# Patient Record
Sex: Male | Born: 1940 | Race: White | Hispanic: No | Marital: Married
Health system: Southern US, Community
[De-identification: ages and names within clinical notes are randomized; demographics above are authoritative.]

---

## 2011-04-25 ENCOUNTER — Inpatient Hospital Stay: Payer: Self-pay | Admitting: Internal Medicine

## 2011-04-25 LAB — COMPREHENSIVE METABOLIC PANEL
Anion Gap: 16 (ref 7–16)
BUN: 43 mg/dL — ABNORMAL HIGH (ref 7–18)
Calcium, Total: 8.7 mg/dL (ref 8.5–10.1)
Chloride: 96 mmol/L — ABNORMAL LOW (ref 98–107)
EGFR (African American): 39 — ABNORMAL LOW
Osmolality: 290 (ref 275–301)
Potassium: 3.6 mmol/L (ref 3.5–5.1)
SGOT(AST): 24 U/L (ref 15–37)
Sodium: 136 mmol/L (ref 136–145)
Total Protein: 7.5 g/dL (ref 6.4–8.2)

## 2011-04-25 LAB — CBC
HCT: 44.2 % (ref 40.0–52.0)
MCHC: 32.6 g/dL (ref 32.0–36.0)
RBC: 5.06 10*6/uL (ref 4.40–5.90)
RDW: 14.3 % (ref 11.5–14.5)
WBC: 15.4 10*3/uL — ABNORMAL HIGH (ref 3.8–10.6)

## 2011-04-25 LAB — CK TOTAL AND CKMB (NOT AT ARMC)
CK, Total: 143 U/L (ref 35–232)
CK, Total: 115 U/L (ref 35–232)
CK-MB: 4.4 ng/mL — ABNORMAL HIGH (ref 0.5–3.6)
CK-MB: 3.9 ng/mL — ABNORMAL HIGH (ref 0.5–3.6)

## 2011-04-25 LAB — APTT: Activated PTT: 76.5 secs — ABNORMAL HIGH (ref 23.6–35.9)

## 2011-04-25 LAB — TROPONIN I: Troponin-I: 0.45 ng/mL — ABNORMAL HIGH

## 2011-04-25 LAB — PRO B NATRIURETIC PEPTIDE: B-Type Natriuretic Peptide: 8072 pg/mL — ABNORMAL HIGH (ref 0–125)

## 2011-04-26 LAB — CBC WITH DIFFERENTIAL/PLATELET
Basophil %: 0.4 %
Eosinophil #: 0.1 10*3/uL (ref 0.0–0.7)
Eosinophil %: 1.2 %
HCT: 40.6 % (ref 40.0–52.0)
HGB: 13.4 g/dL (ref 13.0–18.0)
Lymphocyte %: 13.2 %
MCH: 28.9 pg (ref 26.0–34.0)
MCHC: 33.1 g/dL (ref 32.0–36.0)
MCV: 87 fL (ref 80–100)
Monocyte #: 0.9 10*3/uL — ABNORMAL HIGH (ref 0.0–0.7)
Monocyte %: 7.7 %
Neutrophil #: 9.1 10*3/uL — ABNORMAL HIGH (ref 1.4–6.5)
Neutrophil %: 77.5 %
RBC: 4.66 10*6/uL (ref 4.40–5.90)
WBC: 11.8 10*3/uL — ABNORMAL HIGH (ref 3.8–10.6)

## 2011-04-26 LAB — BASIC METABOLIC PANEL
Anion Gap: 13 (ref 7–16)
BUN: 40 mg/dL — ABNORMAL HIGH (ref 7–18)
Chloride: 100 mmol/L (ref 98–107)
Creatinine: 1.86 mg/dL — ABNORMAL HIGH (ref 0.60–1.30)
EGFR (African American): 47 — ABNORMAL LOW
Glucose: 186 mg/dL — ABNORMAL HIGH (ref 65–99)
Potassium: 3.1 mmol/L — ABNORMAL LOW (ref 3.5–5.1)
Sodium: 139 mmol/L (ref 136–145)

## 2011-04-26 LAB — LIPID PANEL
HDL Cholesterol: 13 mg/dL — ABNORMAL LOW (ref 40–60)
Ldl Cholesterol, Calc: 25 mg/dL (ref 0–100)
Triglycerides: 275 mg/dL — ABNORMAL HIGH (ref 0–200)
VLDL Cholesterol, Calc: 55 mg/dL — ABNORMAL HIGH (ref 5–40)

## 2011-04-26 LAB — HEMOGLOBIN A1C: Hemoglobin A1C: 9.7 % — ABNORMAL HIGH (ref 4.2–6.3)

## 2011-04-26 LAB — APTT
Activated PTT: 56.7 secs — ABNORMAL HIGH (ref 23.6–35.9)
Activated PTT: 58.5 secs — ABNORMAL HIGH (ref 23.6–35.9)
Activated PTT: 70.6 secs — ABNORMAL HIGH (ref 23.6–35.9)

## 2011-04-26 LAB — MAGNESIUM: Magnesium: 1.8 mg/dL

## 2011-04-26 LAB — TROPONIN I: Troponin-I: 0.12 ng/mL — ABNORMAL HIGH

## 2011-04-27 LAB — APTT: Activated PTT: 75.1 secs — ABNORMAL HIGH (ref 23.6–35.9)

## 2011-04-27 LAB — BASIC METABOLIC PANEL
Anion Gap: 12 (ref 7–16)
BUN: 36 mg/dL — ABNORMAL HIGH (ref 7–18)
Calcium, Total: 8.3 mg/dL — ABNORMAL LOW (ref 8.5–10.1)
Chloride: 99 mmol/L (ref 98–107)
Co2: 28 mmol/L (ref 21–32)
Creatinine: 1.74 mg/dL — ABNORMAL HIGH (ref 0.60–1.30)
Potassium: 3.7 mmol/L (ref 3.5–5.1)

## 2011-04-28 LAB — APTT: Activated PTT: 77.6 secs — ABNORMAL HIGH (ref 23.6–35.9)

## 2011-04-28 LAB — CBC WITH DIFFERENTIAL/PLATELET
Basophil #: 0.1 10*3/uL (ref 0.0–0.1)
Eosinophil #: 0.1 10*3/uL (ref 0.0–0.7)
Lymphocyte #: 2.4 10*3/uL (ref 1.0–3.6)
MCH: 28.6 pg (ref 26.0–34.0)
MCHC: 32.7 g/dL (ref 32.0–36.0)
MCV: 87 fL (ref 80–100)
Monocyte #: 0.9 10*3/uL — ABNORMAL HIGH (ref 0.0–0.7)
Neutrophil #: 8.8 10*3/uL — ABNORMAL HIGH (ref 1.4–6.5)
Platelet: 233 10*3/uL (ref 150–440)
RDW: 13.9 % (ref 11.5–14.5)
WBC: 12.3 10*3/uL — ABNORMAL HIGH (ref 3.8–10.6)

## 2011-04-28 LAB — PROTIME-INR: Prothrombin Time: 14.3 secs (ref 11.5–14.7)

## 2011-04-29 LAB — CBC WITH DIFFERENTIAL/PLATELET
Basophil %: 0.3 %
Eosinophil %: 1.3 %
Lymphocyte #: 1.4 10*3/uL (ref 1.0–3.6)
MCHC: 32.6 g/dL (ref 32.0–36.0)
MCV: 88 fL (ref 80–100)
Monocyte #: 0.8 10*3/uL — ABNORMAL HIGH (ref 0.0–0.7)
Monocyte %: 7.3 %
Platelet: 227 10*3/uL (ref 150–440)
RBC: 4.73 10*6/uL (ref 4.40–5.90)
WBC: 10.6 10*3/uL (ref 3.8–10.6)

## 2011-04-29 LAB — BASIC METABOLIC PANEL
Anion Gap: 10 (ref 7–16)
Calcium, Total: 9.2 mg/dL (ref 8.5–10.1)
Co2: 28 mmol/L (ref 21–32)
EGFR (African American): 48 — ABNORMAL LOW
EGFR (Non-African Amer.): 39 — ABNORMAL LOW
Glucose: 99 mg/dL (ref 65–99)
Osmolality: 282 (ref 275–301)
Potassium: 3.9 mmol/L (ref 3.5–5.1)
Sodium: 138 mmol/L (ref 136–145)

## 2011-04-29 LAB — PROTIME-INR
INR: 1.1
Prothrombin Time: 14.5 secs (ref 11.5–14.7)

## 2011-05-01 LAB — CULTURE, BLOOD (SINGLE)

## 2011-06-23 ENCOUNTER — Ambulatory Visit: Payer: Self-pay | Admitting: Family Medicine

## 2011-07-14 ENCOUNTER — Ambulatory Visit: Payer: Self-pay | Admitting: Family Medicine

## 2011-08-02 ENCOUNTER — Ambulatory Visit: Payer: Self-pay | Admitting: Vascular Surgery

## 2011-08-02 LAB — PROTIME-INR: Prothrombin Time: 14.7 secs (ref 11.5–14.7)

## 2011-08-02 LAB — BASIC METABOLIC PANEL
Anion Gap: 7 (ref 7–16)
Chloride: 104 mmol/L (ref 98–107)
EGFR (African American): 44 — ABNORMAL LOW
EGFR (Non-African Amer.): 38 — ABNORMAL LOW
Glucose: 223 mg/dL — ABNORMAL HIGH (ref 65–99)
Potassium: 4.1 mmol/L (ref 3.5–5.1)

## 2011-08-03 ENCOUNTER — Ambulatory Visit: Payer: Self-pay | Admitting: Family Medicine

## 2011-10-03 ENCOUNTER — Ambulatory Visit: Payer: Self-pay | Admitting: Internal Medicine

## 2011-10-24 ENCOUNTER — Ambulatory Visit: Payer: Self-pay | Admitting: Vascular Surgery

## 2011-10-24 LAB — BASIC METABOLIC PANEL
Anion Gap: 9 (ref 7–16)
BUN: 50 mg/dL — ABNORMAL HIGH (ref 7–18)
Calcium, Total: 9.1 mg/dL (ref 8.5–10.1)
Co2: 30 mmol/L (ref 21–32)
EGFR (Non-African Amer.): 34 — ABNORMAL LOW
Glucose: 126 mg/dL — ABNORMAL HIGH (ref 65–99)

## 2011-10-24 LAB — CBC
HGB: 15.1 g/dL (ref 13.0–18.0)
MCH: 28.1 pg (ref 26.0–34.0)
MCV: 88 fL (ref 80–100)
RBC: 5.37 10*6/uL (ref 4.40–5.90)
RDW: 15.2 % — ABNORMAL HIGH (ref 11.5–14.5)
WBC: 9.9 10*3/uL (ref 3.8–10.6)

## 2011-10-26 ENCOUNTER — Inpatient Hospital Stay: Payer: Self-pay | Admitting: Cardiology

## 2011-10-26 LAB — PROTIME-INR
INR: 1.1
Prothrombin Time: 14.2 secs (ref 11.5–14.7)

## 2011-10-27 LAB — BASIC METABOLIC PANEL
Anion Gap: 6 — ABNORMAL LOW (ref 7–16)
BUN: 36 mg/dL — ABNORMAL HIGH (ref 7–18)
Chloride: 104 mmol/L (ref 98–107)
Co2: 30 mmol/L (ref 21–32)
Creatinine: 1.71 mg/dL — ABNORMAL HIGH (ref 0.60–1.30)
EGFR (African American): 46 — ABNORMAL LOW
Osmolality: 297 (ref 275–301)
Potassium: 4.6 mmol/L (ref 3.5–5.1)

## 2011-10-27 LAB — CBC WITH DIFFERENTIAL/PLATELET
Basophil %: 0.4 %
Eosinophil #: 0 10*3/uL (ref 0.0–0.7)
HCT: 47.3 % (ref 40.0–52.0)
HGB: 14.7 g/dL (ref 13.0–18.0)
Lymphocyte #: 1.2 10*3/uL (ref 1.0–3.6)
MCHC: 31 g/dL — ABNORMAL LOW (ref 32.0–36.0)
Monocyte #: 1.6 x10 3/mm — ABNORMAL HIGH (ref 0.2–1.0)
Neutrophil #: 12.9 10*3/uL — ABNORMAL HIGH (ref 1.4–6.5)
RBC: 5.36 10*6/uL (ref 4.40–5.90)
RDW: 15.1 % — ABNORMAL HIGH (ref 11.5–14.5)

## 2011-10-28 LAB — BASIC METABOLIC PANEL
Anion Gap: 9 (ref 7–16)
BUN: 39 mg/dL — ABNORMAL HIGH (ref 7–18)
Calcium, Total: 8.9 mg/dL (ref 8.5–10.1)
Chloride: 105 mmol/L (ref 98–107)
Co2: 28 mmol/L (ref 21–32)
Creatinine: 1.79 mg/dL — ABNORMAL HIGH (ref 0.60–1.30)
Potassium: 4.5 mmol/L (ref 3.5–5.1)
Sodium: 142 mmol/L (ref 136–145)

## 2011-10-28 LAB — CBC WITH DIFFERENTIAL/PLATELET
Basophil #: 0.1 10*3/uL (ref 0.0–0.1)
Basophil %: 0.5 %
HCT: 47.7 % (ref 40.0–52.0)
HGB: 15.7 g/dL (ref 13.0–18.0)
Lymphocyte %: 8.6 %
Monocyte #: 1.7 x10 3/mm — ABNORMAL HIGH (ref 0.2–1.0)
Monocyte %: 10.3 %
Neutrophil #: 13.2 10*3/uL — ABNORMAL HIGH (ref 1.4–6.5)
Neutrophil %: 80.5 %
WBC: 16.4 10*3/uL — ABNORMAL HIGH (ref 3.8–10.6)

## 2011-10-29 LAB — BASIC METABOLIC PANEL
BUN: 35 mg/dL — ABNORMAL HIGH (ref 7–18)
Calcium, Total: 9 mg/dL (ref 8.5–10.1)
Chloride: 107 mmol/L (ref 98–107)
EGFR (Non-African Amer.): 50 — ABNORMAL LOW
Osmolality: 307 (ref 275–301)
Potassium: 4 mmol/L (ref 3.5–5.1)
Sodium: 145 mmol/L (ref 136–145)

## 2011-10-30 LAB — CK TOTAL AND CKMB (NOT AT ARMC)
CK, Total: 4818 U/L — ABNORMAL HIGH (ref 35–232)
CK, Total: 6104 U/L — ABNORMAL HIGH (ref 35–232)
CK-MB: 62.2 ng/mL — ABNORMAL HIGH (ref 0.5–3.6)
CK-MB: 53.5 ng/mL — ABNORMAL HIGH (ref 0.5–3.6)

## 2011-10-30 LAB — CBC WITH DIFFERENTIAL/PLATELET
Basophil #: 0 10*3/uL (ref 0.0–0.1)
Basophil #: 0.2 10*3/uL — ABNORMAL HIGH (ref 0.0–0.1)
Basophil %: 0.1 %
Eosinophil %: 0 %
Eosinophil %: 0 %
HCT: 45.3 % (ref 40.0–52.0)
Lymphocyte #: 0.5 10*3/uL — ABNORMAL LOW (ref 1.0–3.6)
Lymphocyte #: 0.5 10*3/uL — ABNORMAL LOW (ref 1.0–3.6)
Lymphocyte %: 3.7 %
MCH: 28.7 pg (ref 26.0–34.0)
MCV: 89 fL (ref 80–100)
MCV: 87 fL (ref 80–100)
Monocyte #: 1.4 x10 3/mm — ABNORMAL HIGH (ref 0.2–1.0)
Monocyte %: 8.8 %
Monocyte %: 9.4 %
Neutrophil #: 14.1 10*3/uL — ABNORMAL HIGH (ref 1.4–6.5)
Platelet: 210 10*3/uL (ref 150–440)
RBC: 5.11 10*6/uL (ref 4.40–5.90)
RDW: 15.3 % — ABNORMAL HIGH (ref 11.5–14.5)
RDW: 15.6 % — ABNORMAL HIGH (ref 11.5–14.5)
WBC: 16.1 10*3/uL — ABNORMAL HIGH (ref 3.8–10.6)
WBC: 14.3 10*3/uL — ABNORMAL HIGH (ref 3.8–10.6)

## 2011-10-30 LAB — BASIC METABOLIC PANEL
Anion Gap: 11 (ref 7–16)
BUN: 38 mg/dL — ABNORMAL HIGH (ref 7–18)
BUN: 45 mg/dL — ABNORMAL HIGH (ref 7–18)
BUN: 53 mg/dL — ABNORMAL HIGH (ref 7–18)
Calcium, Total: 9 mg/dL (ref 8.5–10.1)
Calcium, Total: 8.5 mg/dL (ref 8.5–10.1)
Chloride: 108 mmol/L — ABNORMAL HIGH (ref 98–107)
Co2: 23 mmol/L (ref 21–32)
Co2: 28 mmol/L (ref 21–32)
Creatinine: 1.9 mg/dL — ABNORMAL HIGH (ref 0.60–1.30)
Creatinine: 1.81 mg/dL — ABNORMAL HIGH (ref 0.60–1.30)
EGFR (African American): 40 — ABNORMAL LOW
EGFR (African American): 43 — ABNORMAL LOW
EGFR (Non-African Amer.): 37 — ABNORMAL LOW
Glucose: 528 mg/dL (ref 65–99)
Glucose: 474 mg/dL — ABNORMAL HIGH (ref 65–99)
Osmolality: 326 (ref 275–301)
Potassium: 3.7 mmol/L (ref 3.5–5.1)
Sodium: 151 mmol/L — ABNORMAL HIGH (ref 136–145)
Sodium: 145 mmol/L (ref 136–145)
Sodium: 146 mmol/L — ABNORMAL HIGH (ref 136–145)

## 2011-10-30 LAB — CBC
HCT: 46.3 % (ref 40.0–52.0)
HGB: 14.8 g/dL (ref 13.0–18.0)
MCV: 91 fL (ref 80–100)
RBC: 5.09 10*6/uL (ref 4.40–5.90)
WBC: 21.6 10*3/uL — ABNORMAL HIGH (ref 3.8–10.6)

## 2011-10-30 LAB — MAGNESIUM: Magnesium: 3.2 mg/dL — ABNORMAL HIGH

## 2011-10-30 LAB — HEPATIC FUNCTION PANEL A (ARMC)
Albumin: 2.2 g/dL — ABNORMAL LOW (ref 3.4–5.0)
Alkaline Phosphatase: 65 U/L (ref 50–136)
Bilirubin,Total: 0.5 mg/dL (ref 0.2–1.0)
SGOT(AST): 72 U/L — ABNORMAL HIGH (ref 15–37)
SGPT (ALT): 49 U/L
Total Protein: 6 g/dL — ABNORMAL LOW (ref 6.4–8.2)

## 2011-10-30 LAB — APTT: Activated PTT: 26.8 secs (ref 23.6–35.9)

## 2011-10-31 LAB — CBC WITH DIFFERENTIAL/PLATELET
Basophil #: 0 10*3/uL (ref 0.0–0.1)
Eosinophil #: 0.1 10*3/uL (ref 0.0–0.7)
Eosinophil %: 0.5 %
HGB: 13.7 g/dL (ref 13.0–18.0)
Lymphocyte #: 1.3 10*3/uL (ref 1.0–3.6)
MCHC: 33.6 g/dL (ref 32.0–36.0)
MCV: 87 fL (ref 80–100)
Monocyte #: 1.1 x10 3/mm — ABNORMAL HIGH (ref 0.2–1.0)
Monocyte %: 8.2 %
Neutrophil #: 11 10*3/uL — ABNORMAL HIGH (ref 1.4–6.5)
Neutrophil %: 81.3 %
Platelet: 169 10*3/uL (ref 150–440)
RBC: 4.68 10*6/uL (ref 4.40–5.90)
WBC: 13.5 10*3/uL — ABNORMAL HIGH (ref 3.8–10.6)

## 2011-10-31 LAB — COMPREHENSIVE METABOLIC PANEL
Albumin: 2 g/dL — ABNORMAL LOW (ref 3.4–5.0)
Alkaline Phosphatase: 52 U/L (ref 50–136)
BUN: 62 mg/dL — ABNORMAL HIGH (ref 7–18)
Bilirubin,Total: 0.5 mg/dL (ref 0.2–1.0)
Co2: 29 mmol/L (ref 21–32)
Creatinine: 2.97 mg/dL — ABNORMAL HIGH (ref 0.60–1.30)
EGFR (African American): 24 — ABNORMAL LOW
EGFR (Non-African Amer.): 20 — ABNORMAL LOW
Glucose: 180 mg/dL — ABNORMAL HIGH (ref 65–99)
Osmolality: 318 (ref 275–301)
Potassium: 3.1 mmol/L — ABNORMAL LOW (ref 3.5–5.1)
SGPT (ALT): 65 U/L
Sodium: 149 mmol/L — ABNORMAL HIGH (ref 136–145)
Total Protein: 5.5 g/dL — ABNORMAL LOW (ref 6.4–8.2)

## 2011-10-31 LAB — CK TOTAL AND CKMB (NOT AT ARMC)
CK, Total: 4943 U/L — ABNORMAL HIGH (ref 35–232)
CK-MB: 40.3 ng/mL — ABNORMAL HIGH (ref 0.5–3.6)

## 2011-10-31 LAB — TROPONIN I: Troponin-I: 1.3 ng/mL — ABNORMAL HIGH

## 2011-10-31 LAB — APTT
Activated PTT: 51.5 secs — ABNORMAL HIGH (ref 23.6–35.9)
Activated PTT: 57.5 secs — ABNORMAL HIGH (ref 23.6–35.9)
Activated PTT: 59.3 secs — ABNORMAL HIGH (ref 23.6–35.9)
Activated PTT: 61.8 secs — ABNORMAL HIGH (ref 23.6–35.9)

## 2011-11-01 LAB — BASIC METABOLIC PANEL
Anion Gap: 8 (ref 7–16)
Calcium, Total: 7.8 mg/dL — ABNORMAL LOW (ref 8.5–10.1)
Chloride: 112 mmol/L — ABNORMAL HIGH (ref 98–107)
Co2: 29 mmol/L (ref 21–32)
Creatinine: 2.61 mg/dL — ABNORMAL HIGH (ref 0.60–1.30)
EGFR (African American): 28 — ABNORMAL LOW
Glucose: 156 mg/dL — ABNORMAL HIGH (ref 65–99)

## 2011-11-01 LAB — CBC WITH DIFFERENTIAL/PLATELET
Basophil #: 0 10*3/uL (ref 0.0–0.1)
Eosinophil #: 0.4 10*3/uL (ref 0.0–0.7)
HCT: 38.3 % — ABNORMAL LOW (ref 40.0–52.0)
HGB: 12.7 g/dL — ABNORMAL LOW (ref 13.0–18.0)
Lymphocyte #: 1.1 10*3/uL (ref 1.0–3.6)
MCHC: 33.1 g/dL (ref 32.0–36.0)
MCV: 87 fL (ref 80–100)
Monocyte #: 0.9 x10 3/mm (ref 0.2–1.0)
Neutrophil #: 11.6 10*3/uL — ABNORMAL HIGH (ref 1.4–6.5)
Platelet: 161 10*3/uL (ref 150–440)
RBC: 4.4 10*6/uL (ref 4.40–5.90)
RDW: 15.9 % — ABNORMAL HIGH (ref 11.5–14.5)

## 2011-11-01 LAB — POTASSIUM: Potassium: 3.9 mmol/L (ref 3.5–5.1)

## 2011-11-01 LAB — APTT: Activated PTT: 93.1 secs — ABNORMAL HIGH (ref 23.6–35.9)

## 2011-11-01 LAB — MAGNESIUM: Magnesium: 2.9 mg/dL — ABNORMAL HIGH

## 2011-11-02 LAB — BASIC METABOLIC PANEL
Anion Gap: 10 (ref 7–16)
BUN: 58 mg/dL — ABNORMAL HIGH (ref 7–18)
Calcium, Total: 8.3 mg/dL — ABNORMAL LOW (ref 8.5–10.1)
Co2: 27 mmol/L (ref 21–32)
EGFR (African American): 32 — ABNORMAL LOW
EGFR (Non-African Amer.): 28 — ABNORMAL LOW
Glucose: 259 mg/dL — ABNORMAL HIGH (ref 65–99)
Osmolality: 323 (ref 275–301)
Potassium: 3.7 mmol/L (ref 3.5–5.1)
Sodium: 150 mmol/L — ABNORMAL HIGH (ref 136–145)

## 2011-11-02 LAB — CBC WITH DIFFERENTIAL/PLATELET
Basophil #: 0 10*3/uL (ref 0.0–0.1)
Eosinophil #: 0.1 10*3/uL (ref 0.0–0.7)
HCT: 40.8 % (ref 40.0–52.0)
Lymphocyte #: 1 10*3/uL (ref 1.0–3.6)
Lymphocyte %: 5.9 %
MCH: 28.5 pg (ref 26.0–34.0)
MCHC: 32.5 g/dL (ref 32.0–36.0)
MCV: 88 fL (ref 80–100)
Neutrophil #: 15.3 10*3/uL — ABNORMAL HIGH (ref 1.4–6.5)
RDW: 16.1 % — ABNORMAL HIGH (ref 11.5–14.5)

## 2011-11-02 LAB — APTT: Activated PTT: 79.8 secs — ABNORMAL HIGH (ref 23.6–35.9)

## 2011-11-02 LAB — CK TOTAL AND CKMB (NOT AT ARMC): CK-MB: 53.2 ng/mL — ABNORMAL HIGH (ref 0.5–3.6)

## 2011-11-03 ENCOUNTER — Ambulatory Visit: Payer: Self-pay | Admitting: Internal Medicine

## 2011-11-03 LAB — COMPREHENSIVE METABOLIC PANEL
Albumin: 1.8 g/dL — ABNORMAL LOW (ref 3.4–5.0)
BUN: 52 mg/dL — ABNORMAL HIGH (ref 7–18)
Bilirubin,Total: 0.5 mg/dL (ref 0.2–1.0)
Calcium, Total: 8.7 mg/dL (ref 8.5–10.1)
Chloride: 112 mmol/L — ABNORMAL HIGH (ref 98–107)
Creatinine: 1.98 mg/dL — ABNORMAL HIGH (ref 0.60–1.30)
Osmolality: 320 (ref 275–301)
Potassium: 3.3 mmol/L — ABNORMAL LOW (ref 3.5–5.1)
SGOT(AST): 174 U/L — ABNORMAL HIGH (ref 15–37)
SGPT (ALT): 77 U/L
Sodium: 151 mmol/L — ABNORMAL HIGH (ref 136–145)
Total Protein: 6.3 g/dL — ABNORMAL LOW (ref 6.4–8.2)

## 2011-11-03 LAB — CBC WITH DIFFERENTIAL/PLATELET
Basophil #: 0 10*3/uL (ref 0.0–0.1)
Basophil %: 0.3 %
Eosinophil #: 0.3 10*3/uL (ref 0.0–0.7)
Lymphocyte #: 1.3 10*3/uL (ref 1.0–3.6)
MCH: 28.3 pg (ref 26.0–34.0)
MCHC: 32.3 g/dL (ref 32.0–36.0)
MCV: 88 fL (ref 80–100)
Monocyte #: 1.4 x10 3/mm — ABNORMAL HIGH (ref 0.2–1.0)
Neutrophil %: 82.6 %
Platelet: 201 10*3/uL (ref 150–440)
RBC: 4.7 10*6/uL (ref 4.40–5.90)
RDW: 16 % — ABNORMAL HIGH (ref 11.5–14.5)
WBC: 17.6 10*3/uL — ABNORMAL HIGH (ref 3.8–10.6)

## 2011-11-03 LAB — POTASSIUM: Potassium: 3.2 mmol/L — ABNORMAL LOW (ref 3.5–5.1)

## 2011-11-03 LAB — APTT: Activated PTT: 74.7 secs — ABNORMAL HIGH (ref 23.6–35.9)

## 2011-11-04 LAB — CBC WITH DIFFERENTIAL/PLATELET
Eosinophil #: 0.6 10*3/uL (ref 0.0–0.7)
Lymphocyte #: 1.7 10*3/uL (ref 1.0–3.6)
Lymphocyte %: 9.5 %
MCH: 28.3 pg (ref 26.0–34.0)
Monocyte #: 1.4 x10 3/mm — ABNORMAL HIGH (ref 0.2–1.0)
Monocyte %: 7.7 %
Neutrophil #: 14.5 10*3/uL — ABNORMAL HIGH (ref 1.4–6.5)
Neutrophil %: 79.2 %
Platelet: 226 10*3/uL (ref 150–440)
RDW: 15.8 % — ABNORMAL HIGH (ref 11.5–14.5)
WBC: 18.3 10*3/uL — ABNORMAL HIGH (ref 3.8–10.6)

## 2011-11-04 LAB — BASIC METABOLIC PANEL
BUN: 48 mg/dL — ABNORMAL HIGH (ref 7–18)
Chloride: 110 mmol/L — ABNORMAL HIGH (ref 98–107)
Co2: 28 mmol/L (ref 21–32)
Creatinine: 2.1 mg/dL — ABNORMAL HIGH (ref 0.60–1.30)
EGFR (African American): 36 — ABNORMAL LOW
Glucose: 181 mg/dL — ABNORMAL HIGH (ref 65–99)
Sodium: 148 mmol/L — ABNORMAL HIGH (ref 136–145)

## 2011-11-05 LAB — CBC WITH DIFFERENTIAL/PLATELET
Basophil #: 0.1 10*3/uL (ref 0.0–0.1)
Basophil %: 0.6 %
Eosinophil %: 3.4 %
HCT: 37.8 % — ABNORMAL LOW (ref 40.0–52.0)
HGB: 12.4 g/dL — ABNORMAL LOW (ref 13.0–18.0)
Lymphocyte #: 1.4 10*3/uL (ref 1.0–3.6)
Lymphocyte %: 8.6 %
MCHC: 32.9 g/dL (ref 32.0–36.0)
MCV: 87 fL (ref 80–100)
Monocyte %: 7 %
Neutrophil #: 13.5 10*3/uL — ABNORMAL HIGH (ref 1.4–6.5)
RBC: 4.36 10*6/uL — ABNORMAL LOW (ref 4.40–5.90)
RDW: 15.3 % — ABNORMAL HIGH (ref 11.5–14.5)
WBC: 16.7 10*3/uL — ABNORMAL HIGH (ref 3.8–10.6)

## 2011-11-05 LAB — BASIC METABOLIC PANEL
Anion Gap: 10 (ref 7–16)
BUN: 39 mg/dL — ABNORMAL HIGH (ref 7–18)
Chloride: 106 mmol/L (ref 98–107)
Co2: 27 mmol/L (ref 21–32)
Creatinine: 1.77 mg/dL — ABNORMAL HIGH (ref 0.60–1.30)
EGFR (Non-African Amer.): 38 — ABNORMAL LOW
Glucose: 210 mg/dL — ABNORMAL HIGH (ref 65–99)

## 2011-11-05 LAB — APTT: Activated PTT: 67 secs — ABNORMAL HIGH (ref 23.6–35.9)

## 2011-11-06 LAB — VANCOMYCIN, TROUGH: Vancomycin, Trough: 12 ug/mL (ref 10–20)

## 2011-11-07 LAB — BASIC METABOLIC PANEL
Anion Gap: 9 (ref 7–16)
BUN: 35 mg/dL — ABNORMAL HIGH (ref 7–18)
Calcium, Total: 8.4 mg/dL — ABNORMAL LOW (ref 8.5–10.1)
Co2: 30 mmol/L (ref 21–32)
EGFR (African American): 46 — ABNORMAL LOW
EGFR (Non-African Amer.): 39 — ABNORMAL LOW
Glucose: 135 mg/dL — ABNORMAL HIGH (ref 65–99)
Osmolality: 295 (ref 275–301)
Potassium: 3.9 mmol/L (ref 3.5–5.1)
Sodium: 143 mmol/L (ref 136–145)

## 2011-11-07 LAB — CBC WITH DIFFERENTIAL/PLATELET
Basophil #: 0.1 10*3/uL (ref 0.0–0.1)
Eosinophil #: 0.5 10*3/uL (ref 0.0–0.7)
HCT: 38.6 % — ABNORMAL LOW (ref 40.0–52.0)
HGB: 12.3 g/dL — ABNORMAL LOW (ref 13.0–18.0)
Lymphocyte %: 10.8 %
MCHC: 31.9 g/dL — ABNORMAL LOW (ref 32.0–36.0)
MCV: 87 fL (ref 80–100)
Monocyte %: 5.7 %
Neutrophil #: 11.7 10*3/uL — ABNORMAL HIGH (ref 1.4–6.5)
Neutrophil %: 79.4 %
Platelet: 276 10*3/uL (ref 150–440)
RBC: 4.44 10*6/uL (ref 4.40–5.90)
RDW: 15.6 % — ABNORMAL HIGH (ref 11.5–14.5)

## 2011-11-07 LAB — PROTIME-INR
INR: 1.1
Prothrombin Time: 14.6 secs (ref 11.5–14.7)

## 2011-11-08 LAB — APTT: Activated PTT: 129.1 secs — ABNORMAL HIGH (ref 23.6–35.9)

## 2011-11-08 LAB — PROTIME-INR
INR: 1.2
Prothrombin Time: 15.6 secs — ABNORMAL HIGH (ref 11.5–14.7)

## 2011-11-09 LAB — BASIC METABOLIC PANEL
Anion Gap: 7 (ref 7–16)
BUN: 26 mg/dL — ABNORMAL HIGH (ref 7–18)
Calcium, Total: 8.6 mg/dL (ref 8.5–10.1)
EGFR (African American): 56 — ABNORMAL LOW
EGFR (Non-African Amer.): 48 — ABNORMAL LOW
Glucose: 223 mg/dL — ABNORMAL HIGH (ref 65–99)
Osmolality: 291 (ref 275–301)
Potassium: 3.3 mmol/L — ABNORMAL LOW (ref 3.5–5.1)

## 2011-11-09 LAB — HEMOGLOBIN: HGB: 11 g/dL — ABNORMAL LOW (ref 13.0–18.0)

## 2011-11-09 LAB — APTT: Activated PTT: 106.8 secs — ABNORMAL HIGH (ref 23.6–35.9)

## 2011-11-09 LAB — PROTIME-INR: Prothrombin Time: 14.8 secs — ABNORMAL HIGH (ref 11.5–14.7)

## 2011-12-04 ENCOUNTER — Ambulatory Visit: Payer: Self-pay | Admitting: Internal Medicine

## 2012-04-18 ENCOUNTER — Encounter: Payer: Self-pay | Admitting: Cardiology

## 2012-05-05 ENCOUNTER — Encounter: Payer: Self-pay | Admitting: Cardiology

## 2012-06-02 ENCOUNTER — Encounter: Payer: Self-pay | Admitting: Cardiology

## 2012-07-02 ENCOUNTER — Encounter: Payer: Self-pay | Admitting: Otolaryngology

## 2012-07-03 ENCOUNTER — Encounter: Payer: Self-pay | Admitting: Otolaryngology

## 2012-07-27 ENCOUNTER — Inpatient Hospital Stay: Payer: Self-pay | Admitting: Internal Medicine

## 2012-07-27 LAB — CBC WITH DIFFERENTIAL/PLATELET
Basophil #: 0.1 10*3/uL (ref 0.0–0.1)
Basophil %: 0.5 %
Eosinophil #: 0.1 10*3/uL (ref 0.0–0.7)
HCT: 48.2 % (ref 40.0–52.0)
HGB: 15.5 g/dL (ref 13.0–18.0)
Lymphocyte #: 2.6 10*3/uL (ref 1.0–3.6)
Lymphocyte %: 19.9 %
MCHC: 32.1 g/dL (ref 32.0–36.0)
Monocyte %: 7.1 %
Neutrophil #: 9.4 10*3/uL — ABNORMAL HIGH (ref 1.4–6.5)
Neutrophil %: 71.9 %
RBC: 5.56 10*6/uL (ref 4.40–5.90)
RDW: 16.4 % — ABNORMAL HIGH (ref 11.5–14.5)

## 2012-07-27 LAB — COMPREHENSIVE METABOLIC PANEL
Albumin: 3.1 g/dL — ABNORMAL LOW (ref 3.4–5.0)
Calcium, Total: 8.9 mg/dL (ref 8.5–10.1)
Chloride: 106 mmol/L (ref 98–107)
Co2: 23 mmol/L (ref 21–32)
EGFR (African American): 55 — ABNORMAL LOW
EGFR (Non-African Amer.): 47 — ABNORMAL LOW
Osmolality: 285 (ref 275–301)
Potassium: 4.3 mmol/L (ref 3.5–5.1)
SGOT(AST): 184 U/L — ABNORMAL HIGH (ref 15–37)
SGPT (ALT): 155 U/L — ABNORMAL HIGH (ref 12–78)
Sodium: 136 mmol/L (ref 136–145)
Total Protein: 7.2 g/dL (ref 6.4–8.2)

## 2012-07-27 LAB — PRO B NATRIURETIC PEPTIDE: B-Type Natriuretic Peptide: 5230 pg/mL — ABNORMAL HIGH (ref 0–125)

## 2012-07-27 LAB — PROTIME-INR
INR: 1
Prothrombin Time: 13.4 secs (ref 11.5–14.7)

## 2012-07-27 LAB — CK TOTAL AND CKMB (NOT AT ARMC): CK-MB: 5.3 ng/mL — ABNORMAL HIGH (ref 0.5–3.6)

## 2012-07-28 LAB — BASIC METABOLIC PANEL
BUN: 38 mg/dL — ABNORMAL HIGH (ref 7–18)
Chloride: 104 mmol/L (ref 98–107)
Co2: 22 mmol/L (ref 21–32)
Creatinine: 1.52 mg/dL — ABNORMAL HIGH (ref 0.60–1.30)
EGFR (African American): 53 — ABNORMAL LOW
EGFR (Non-African Amer.): 46 — ABNORMAL LOW
Osmolality: 291 (ref 275–301)

## 2012-07-28 LAB — HEPATIC FUNCTION PANEL A (ARMC)
Albumin: 2.6 g/dL — ABNORMAL LOW (ref 3.4–5.0)
Alkaline Phosphatase: 135 U/L (ref 50–136)
Bilirubin, Direct: <0.05 mg/dL (ref 0.00–0.20)
Bilirubin,Total: 0.5 mg/dL (ref 0.2–1.0)
Total Protein: 6.4 g/dL (ref 6.4–8.2)

## 2012-07-28 LAB — CBC WITH DIFFERENTIAL/PLATELET
Basophil %: 0.1 %
Eosinophil #: 0 10*3/uL (ref 0.0–0.7)
HGB: 14 g/dL (ref 13.0–18.0)
Lymphocyte #: 0.9 10*3/uL — ABNORMAL LOW (ref 1.0–3.6)
MCH: 28.5 pg (ref 26.0–34.0)
Monocyte %: 1.3 %
Neutrophil #: 7.1 10*3/uL — ABNORMAL HIGH (ref 1.4–6.5)
Neutrophil %: 87.1 %
Platelet: 96 10*3/uL — ABNORMAL LOW (ref 150–440)
RBC: 4.91 10*6/uL (ref 4.40–5.90)

## 2012-07-28 LAB — LIPID PANEL
HDL Cholesterol: 42 mg/dL (ref 40–60)
Ldl Cholesterol, Calc: 73 mg/dL (ref 0–100)

## 2012-07-28 LAB — TROPONIN I
Troponin-I: 0.04 ng/mL
Troponin-I: 0.03 ng/mL

## 2012-08-02 ENCOUNTER — Encounter: Payer: Self-pay | Admitting: Otolaryngology

## 2013-03-26 ENCOUNTER — Ambulatory Visit: Payer: Self-pay | Admitting: Family Medicine

## 2013-05-30 ENCOUNTER — Inpatient Hospital Stay: Payer: Self-pay | Admitting: Internal Medicine

## 2013-05-30 LAB — COMPREHENSIVE METABOLIC PANEL
ALK PHOS: 224 U/L — AB
Albumin: 2.7 g/dL — ABNORMAL LOW (ref 3.4–5.0)
Anion Gap: 5 — ABNORMAL LOW (ref 7–16)
BILIRUBIN TOTAL: 0.7 mg/dL (ref 0.2–1.0)
BUN: 25 mg/dL — AB (ref 7–18)
Calcium, Total: 8.9 mg/dL (ref 8.5–10.1)
Chloride: 104 mmol/L (ref 98–107)
Co2: 26 mmol/L (ref 21–32)
Creatinine: 1.59 mg/dL — ABNORMAL HIGH (ref 0.60–1.30)
EGFR (African American): 50 — ABNORMAL LOW
GFR CALC NON AF AMER: 43 — AB
Glucose: 251 mg/dL — ABNORMAL HIGH (ref 65–99)
Osmolality: 283 (ref 275–301)
Potassium: 4.7 mmol/L (ref 3.5–5.1)
SGOT(AST): 30 U/L (ref 15–37)
SGPT (ALT): 61 U/L (ref 12–78)
SODIUM: 135 mmol/L — AB (ref 136–145)
Total Protein: 6.9 g/dL (ref 6.4–8.2)

## 2013-05-30 LAB — URINALYSIS, COMPLETE
BLOOD: NEGATIVE
Bilirubin,UR: NEGATIVE
Glucose,UR: 50 mg/dL (ref 0–75)
Hyaline Cast: 1
Ketone: NEGATIVE
Leukocyte Esterase: NEGATIVE
Nitrite: NEGATIVE
Ph: 6 (ref 4.5–8.0)
Protein: 100
RBC,UR: 1 /HPF (ref 0–5)
SQUAMOUS EPITHELIAL: NONE SEEN
Specific Gravity: 1.005 (ref 1.003–1.030)
WBC UR: <1 /HPF (ref 0–5)

## 2013-05-30 LAB — CK TOTAL AND CKMB (NOT AT ARMC)
CK, Total: 73 U/L
CK, Total: 70 U/L
CK-MB: 5.2 ng/mL — ABNORMAL HIGH (ref 0.5–3.6)
CK-MB: 4.8 ng/mL — ABNORMAL HIGH (ref 0.5–3.6)

## 2013-05-30 LAB — CBC
HCT: 43.1 % (ref 40.0–52.0)
HGB: 14.3 g/dL (ref 13.0–18.0)
MCH: 29 pg (ref 26.0–34.0)
MCHC: 33.1 g/dL (ref 32.0–36.0)
MCV: 88 fL (ref 80–100)
Platelet: 263 10*3/uL (ref 150–440)
RBC: 4.92 10*6/uL (ref 4.40–5.90)
RDW: 15.7 % — ABNORMAL HIGH (ref 11.5–14.5)
WBC: 9.5 10*3/uL (ref 3.8–10.6)

## 2013-05-30 LAB — TROPONIN I
Troponin-I: 0.03 ng/mL
Troponin-I: 0.03 ng/mL

## 2013-05-30 LAB — PROTIME-INR
INR: 1
Prothrombin Time: 13.3 secs (ref 11.5–14.7)

## 2013-05-30 LAB — PRO B NATRIURETIC PEPTIDE: B-Type Natriuretic Peptide: 7934 pg/mL — ABNORMAL HIGH (ref 0–125)

## 2013-05-31 LAB — BASIC METABOLIC PANEL
ANION GAP: 5 — AB (ref 7–16)
BUN: 25 mg/dL — ABNORMAL HIGH (ref 7–18)
CALCIUM: 9.2 mg/dL (ref 8.5–10.1)
Chloride: 108 mmol/L — ABNORMAL HIGH (ref 98–107)
Co2: 26 mmol/L (ref 21–32)
Creatinine: 1.65 mg/dL — ABNORMAL HIGH (ref 0.60–1.30)
GFR CALC AF AMER: 48 — AB
GFR CALC NON AF AMER: 41 — AB
Glucose: 159 mg/dL — ABNORMAL HIGH (ref 65–99)
OSMOLALITY: 285 (ref 275–301)
Potassium: 3.9 mmol/L (ref 3.5–5.1)
SODIUM: 139 mmol/L (ref 136–145)

## 2013-05-31 LAB — TROPONIN I: Troponin-I: 0.03 ng/mL

## 2013-05-31 LAB — CK TOTAL AND CKMB (NOT AT ARMC)
CK, Total: 63 U/L
CK-MB: 4.6 ng/mL — ABNORMAL HIGH (ref 0.5–3.6)

## 2013-06-21 ENCOUNTER — Ambulatory Visit: Payer: Self-pay | Admitting: Vascular Surgery

## 2013-08-29 ENCOUNTER — Ambulatory Visit: Payer: Self-pay | Admitting: Vascular Surgery

## 2013-08-29 LAB — BASIC METABOLIC PANEL
ANION GAP: 7 (ref 7–16)
BUN: 35 mg/dL — ABNORMAL HIGH (ref 7–18)
CO2: 28 mmol/L (ref 21–32)
Calcium, Total: 9.5 mg/dL (ref 8.5–10.1)
Chloride: 101 mmol/L (ref 98–107)
Creatinine: 1.77 mg/dL — ABNORMAL HIGH (ref 0.60–1.30)
GFR CALC AF AMER: 44 — AB
GFR CALC NON AF AMER: 38 — AB
Glucose: 96 mg/dL (ref 65–99)
OSMOLALITY: 280 (ref 275–301)
Potassium: 4.2 mmol/L (ref 3.5–5.1)
Sodium: 136 mmol/L (ref 136–145)

## 2013-08-29 LAB — URINALYSIS, COMPLETE
BILIRUBIN, UR: NEGATIVE
Bacteria: NONE SEEN
Blood: NEGATIVE
GLUCOSE, UR: NEGATIVE mg/dL (ref 0–75)
Ketone: NEGATIVE
LEUKOCYTE ESTERASE: NEGATIVE
NITRITE: NEGATIVE
Ph: 5 (ref 4.5–8.0)
SPECIFIC GRAVITY: 1.011 (ref 1.003–1.030)
SQUAMOUS EPITHELIAL: NONE SEEN
WBC UR: 2 /HPF (ref 0–5)

## 2013-08-29 LAB — CBC
HCT: 37.2 % — AB (ref 40.0–52.0)
HGB: 11.9 g/dL — AB (ref 13.0–18.0)
MCH: 27.6 pg (ref 26.0–34.0)
MCHC: 32.1 g/dL (ref 32.0–36.0)
MCV: 86 fL (ref 80–100)
Platelet: 190 10*3/uL (ref 150–440)
RBC: 4.32 10*6/uL — ABNORMAL LOW (ref 4.40–5.90)
RDW: 16.3 % — AB (ref 11.5–14.5)
WBC: 10.4 10*3/uL (ref 3.8–10.6)

## 2013-09-02 ENCOUNTER — Inpatient Hospital Stay: Payer: Self-pay | Admitting: Internal Medicine

## 2013-09-02 LAB — CBC WITH DIFFERENTIAL/PLATELET
Basophil #: 0 10*3/uL (ref 0.0–0.1)
Basophil %: 0.5 %
EOS PCT: 1 %
Eosinophil #: 0.1 10*3/uL (ref 0.0–0.7)
HCT: 36.2 % — ABNORMAL LOW (ref 40.0–52.0)
HGB: 11.9 g/dL — ABNORMAL LOW (ref 13.0–18.0)
LYMPHS PCT: 14.8 %
Lymphocyte #: 1.1 10*3/uL (ref 1.0–3.6)
MCH: 27.7 pg (ref 26.0–34.0)
MCHC: 32.8 g/dL (ref 32.0–36.0)
MCV: 85 fL (ref 80–100)
MONOS PCT: 7 %
Monocyte #: 0.5 x10 3/mm (ref 0.2–1.0)
Neutrophil #: 5.6 10*3/uL (ref 1.4–6.5)
Neutrophil %: 76.7 %
Platelet: 163 10*3/uL (ref 150–440)
RBC: 4.28 10*6/uL — AB (ref 4.40–5.90)
RDW: 16.3 % — AB (ref 11.5–14.5)
WBC: 7.4 10*3/uL (ref 3.8–10.6)

## 2013-09-02 LAB — COMPREHENSIVE METABOLIC PANEL
ALK PHOS: 341 U/L — AB
Albumin: 2.7 g/dL — ABNORMAL LOW (ref 3.4–5.0)
Anion Gap: 8 (ref 7–16)
BUN: 47 mg/dL — AB (ref 7–18)
Bilirubin,Total: 0.9 mg/dL (ref 0.2–1.0)
CALCIUM: 9 mg/dL (ref 8.5–10.1)
CO2: 26 mmol/L (ref 21–32)
Chloride: 98 mmol/L (ref 98–107)
Creatinine: 2.13 mg/dL — ABNORMAL HIGH (ref 0.60–1.30)
EGFR (Non-African Amer.): 30 — ABNORMAL LOW
GFR CALC AF AMER: 35 — AB
GLUCOSE: 137 mg/dL — AB (ref 65–99)
Osmolality: 279 (ref 275–301)
Potassium: 3.8 mmol/L (ref 3.5–5.1)
SGOT(AST): 105 U/L — ABNORMAL HIGH (ref 15–37)
SGPT (ALT): 136 U/L — ABNORMAL HIGH (ref 12–78)
SODIUM: 132 mmol/L — AB (ref 136–145)
Total Protein: 6.8 g/dL (ref 6.4–8.2)

## 2013-09-02 LAB — TROPONIN I: Troponin-I: 0.02 ng/mL

## 2013-09-02 LAB — CK TOTAL AND CKMB (NOT AT ARMC)
CK, TOTAL: 109 U/L
CK-MB: 3.4 ng/mL (ref 0.5–3.6)

## 2013-09-03 LAB — COMPREHENSIVE METABOLIC PANEL
ALK PHOS: 267 U/L — AB
ALT: 99 U/L — AB (ref 12–78)
AST: 67 U/L — AB (ref 15–37)
Albumin: 2.5 g/dL — ABNORMAL LOW (ref 3.4–5.0)
Anion Gap: 7 (ref 7–16)
BUN: 38 mg/dL — AB (ref 7–18)
Bilirubin,Total: 0.7 mg/dL (ref 0.2–1.0)
CREATININE: 1.78 mg/dL — AB (ref 0.60–1.30)
Calcium, Total: 8.8 mg/dL (ref 8.5–10.1)
Chloride: 101 mmol/L (ref 98–107)
Co2: 28 mmol/L (ref 21–32)
EGFR (African American): 43 — ABNORMAL LOW
EGFR (Non-African Amer.): 37 — ABNORMAL LOW
Glucose: 144 mg/dL — ABNORMAL HIGH (ref 65–99)
Osmolality: 284 (ref 275–301)
Potassium: 3.6 mmol/L (ref 3.5–5.1)
Sodium: 136 mmol/L (ref 136–145)
TOTAL PROTEIN: 6.5 g/dL (ref 6.4–8.2)

## 2013-09-03 LAB — CBC WITH DIFFERENTIAL/PLATELET
BASOS ABS: 0 10*3/uL (ref 0.0–0.1)
BASOS PCT: 0.5 %
EOS PCT: 2 %
Eosinophil #: 0.1 10*3/uL (ref 0.0–0.7)
HCT: 36 % — AB (ref 40.0–52.0)
HGB: 11.7 g/dL — AB (ref 13.0–18.0)
LYMPHS ABS: 1.3 10*3/uL (ref 1.0–3.6)
Lymphocyte %: 20.6 %
MCH: 27.6 pg (ref 26.0–34.0)
MCHC: 32.6 g/dL (ref 32.0–36.0)
MCV: 85 fL (ref 80–100)
Monocyte #: 0.7 x10 3/mm (ref 0.2–1.0)
Monocyte %: 10.9 %
Neutrophil #: 4.3 10*3/uL (ref 1.4–6.5)
Neutrophil %: 66 %
PLATELETS: 162 10*3/uL (ref 150–440)
RBC: 4.24 10*6/uL — ABNORMAL LOW (ref 4.40–5.90)
RDW: 16.3 % — ABNORMAL HIGH (ref 11.5–14.5)
WBC: 6.5 10*3/uL (ref 3.8–10.6)

## 2013-09-03 LAB — LIPID PANEL
Cholesterol: 103 mg/dL (ref 0–200)
HDL Cholesterol: 23 mg/dL — ABNORMAL LOW (ref 40–60)
LDL CHOLESTEROL, CALC: 46 mg/dL (ref 0–100)
Triglycerides: 172 mg/dL (ref 0–200)
VLDL Cholesterol, Calc: 34 mg/dL (ref 5–40)

## 2013-10-18 ENCOUNTER — Inpatient Hospital Stay: Payer: Self-pay | Admitting: Vascular Surgery

## 2013-10-18 LAB — BASIC METABOLIC PANEL
ANION GAP: 7 (ref 7–16)
BUN: 39 mg/dL — ABNORMAL HIGH (ref 7–18)
CALCIUM: 9.1 mg/dL (ref 8.5–10.1)
CO2: 28 mmol/L (ref 21–32)
Chloride: 101 mmol/L (ref 98–107)
Creatinine: 1.99 mg/dL — ABNORMAL HIGH (ref 0.60–1.30)
EGFR (African American): 38 — ABNORMAL LOW
EGFR (Non-African Amer.): 33 — ABNORMAL LOW
Glucose: 186 mg/dL — ABNORMAL HIGH (ref 65–99)
Osmolality: 286 (ref 275–301)
POTASSIUM: 4.4 mmol/L (ref 3.5–5.1)
Sodium: 136 mmol/L (ref 136–145)

## 2013-10-18 LAB — PROTIME-INR
INR: 1
Prothrombin Time: 12.8 secs (ref 11.5–14.7)

## 2013-10-19 LAB — PROTIME-INR
INR: 1.1
PROTHROMBIN TIME: 14.5 s (ref 11.5–14.7)

## 2013-10-19 LAB — CBC WITH DIFFERENTIAL/PLATELET
Basophil #: 0 10*3/uL (ref 0.0–0.1)
Basophil %: 0.4 %
Eosinophil #: 0 10*3/uL (ref 0.0–0.7)
Eosinophil %: 0.1 %
HCT: 31.3 % — AB (ref 40.0–52.0)
HGB: 9.9 g/dL — ABNORMAL LOW (ref 13.0–18.0)
Lymphocyte #: 0.8 10*3/uL — ABNORMAL LOW (ref 1.0–3.6)
Lymphocyte %: 9.2 %
MCH: 26.7 pg (ref 26.0–34.0)
MCHC: 31.7 g/dL — ABNORMAL LOW (ref 32.0–36.0)
MCV: 84 fL (ref 80–100)
MONOS PCT: 8.9 %
Monocyte #: 0.8 x10 3/mm (ref 0.2–1.0)
Neutrophil #: 7.5 10*3/uL — ABNORMAL HIGH (ref 1.4–6.5)
Neutrophil %: 81.4 %
PLATELETS: 158 10*3/uL (ref 150–440)
RBC: 3.72 10*6/uL — ABNORMAL LOW (ref 4.40–5.90)
RDW: 16.8 % — AB (ref 11.5–14.5)
WBC: 9.2 10*3/uL (ref 3.8–10.6)

## 2013-10-19 LAB — BASIC METABOLIC PANEL
Anion Gap: 7 (ref 7–16)
BUN: 39 mg/dL — ABNORMAL HIGH (ref 7–18)
CO2: 25 mmol/L (ref 21–32)
Calcium, Total: 8.1 mg/dL — ABNORMAL LOW (ref 8.5–10.1)
Chloride: 105 mmol/L (ref 98–107)
Creatinine: 1.88 mg/dL — ABNORMAL HIGH (ref 0.60–1.30)
EGFR (Non-African Amer.): 35 — ABNORMAL LOW
GFR CALC AF AMER: 40 — AB
Glucose: 172 mg/dL — ABNORMAL HIGH (ref 65–99)
Osmolality: 287 (ref 275–301)
POTASSIUM: 4.8 mmol/L (ref 3.5–5.1)
SODIUM: 137 mmol/L (ref 136–145)

## 2013-10-19 LAB — APTT: ACTIVATED PTT: 31.5 s (ref 23.6–35.9)

## 2013-12-20 LAB — PATHOLOGY REPORT

## 2014-01-17 ENCOUNTER — Inpatient Hospital Stay: Payer: Self-pay | Admitting: Internal Medicine

## 2014-01-18 LAB — CBC WITH DIFFERENTIAL/PLATELET
BASOS ABS: 0.1 10*3/uL (ref 0.0–0.1)
Basophil #: 0.1 10*3/uL (ref 0.0–0.1)
Basophil %: 0.7 %
Basophil %: 0.9 %
EOS ABS: 0.1 10*3/uL (ref 0.0–0.7)
Eosinophil #: 0.1 10*3/uL (ref 0.0–0.7)
Eosinophil %: 1 %
Eosinophil %: 0.6 %
HCT: 30.9 % — ABNORMAL LOW (ref 40.0–52.0)
HCT: 32.6 % — AB (ref 40.0–52.0)
HGB: 9.4 g/dL — ABNORMAL LOW (ref 13.0–18.0)
HGB: 9.9 g/dL — ABNORMAL LOW (ref 13.0–18.0)
LYMPHS ABS: 0.7 10*3/uL — AB (ref 1.0–3.6)
LYMPHS PCT: 7.3 %
Lymphocyte #: 0.6 10*3/uL — ABNORMAL LOW (ref 1.0–3.6)
Lymphocyte %: 6.6 %
MCH: 23.6 pg — AB (ref 26.0–34.0)
MCH: 23.3 pg — AB (ref 26.0–34.0)
MCHC: 30.5 g/dL — ABNORMAL LOW (ref 32.0–36.0)
MCHC: 30.3 g/dL — ABNORMAL LOW (ref 32.0–36.0)
MCV: 78 fL — AB (ref 80–100)
MCV: 77 fL — AB (ref 80–100)
MONO ABS: 0.9 x10 3/mm (ref 0.2–1.0)
MONO ABS: 0.9 x10 3/mm (ref 0.2–1.0)
MONOS PCT: 10 %
Monocyte %: 9.2 %
NEUTROS ABS: 7.2 10*3/uL — AB (ref 1.4–6.5)
NEUTROS PCT: 81.7 %
NEUTROS PCT: 82 %
Neutrophil #: 8 10*3/uL — ABNORMAL HIGH (ref 1.4–6.5)
Platelet: 173 10*3/uL (ref 150–440)
Platelet: 191 10*3/uL (ref 150–440)
RBC: 3.98 10*6/uL — AB (ref 4.40–5.90)
RBC: 4.23 10*6/uL — AB (ref 4.40–5.90)
RDW: 18.9 % — AB (ref 11.5–14.5)
RDW: 19.2 % — AB (ref 11.5–14.5)
WBC: 8.8 10*3/uL (ref 3.8–10.6)
WBC: 9.8 10*3/uL (ref 3.8–10.6)

## 2014-01-18 LAB — URINALYSIS, COMPLETE
BILIRUBIN, UR: NEGATIVE
GLUCOSE, UR: NEGATIVE mg/dL (ref 0–75)
Hyaline Cast: 8
Ketone: NEGATIVE
Leukocyte Esterase: NEGATIVE
NITRITE: NEGATIVE
Ph: 5 (ref 4.5–8.0)
Protein: 100
SPECIFIC GRAVITY: 1.012 (ref 1.003–1.030)
Squamous Epithelial: 1

## 2014-01-18 LAB — COMPREHENSIVE METABOLIC PANEL
ALBUMIN: 2.7 g/dL — AB (ref 3.4–5.0)
ALK PHOS: 68 U/L
ALT: 18 U/L
Anion Gap: 9 (ref 7–16)
BUN: 63 mg/dL — AB (ref 7–18)
Bilirubin,Total: 0.5 mg/dL (ref 0.2–1.0)
Calcium, Total: 8.6 mg/dL (ref 8.5–10.1)
Chloride: 97 mmol/L — ABNORMAL LOW (ref 98–107)
Co2: 31 mmol/L (ref 21–32)
Creatinine: 2.3 mg/dL — ABNORMAL HIGH (ref 0.60–1.30)
EGFR (African American): 36 — ABNORMAL LOW
GFR CALC NON AF AMER: 30 — AB
GLUCOSE: 205 mg/dL — AB (ref 65–99)
OSMOLALITY: 298 (ref 275–301)
Potassium: 4.4 mmol/L (ref 3.5–5.1)
SGOT(AST): 25 U/L (ref 15–37)
Sodium: 137 mmol/L (ref 136–145)
Total Protein: 6.3 g/dL — ABNORMAL LOW (ref 6.4–8.2)

## 2014-01-18 LAB — PROTIME-INR
INR: 1.3
PROTHROMBIN TIME: 15.6 s — AB (ref 11.5–14.7)

## 2014-01-19 LAB — CBC WITH DIFFERENTIAL/PLATELET
Basophil #: 0.1 10*3/uL (ref 0.0–0.1)
Basophil %: 0.6 %
EOS ABS: 0.1 10*3/uL (ref 0.0–0.7)
EOS PCT: 0.9 %
HCT: 30 % — ABNORMAL LOW (ref 40.0–52.0)
HGB: 9 g/dL — ABNORMAL LOW (ref 13.0–18.0)
LYMPHS ABS: 0.8 10*3/uL — AB (ref 1.0–3.6)
Lymphocyte %: 7.7 %
MCH: 23.2 pg — ABNORMAL LOW (ref 26.0–34.0)
MCHC: 30.1 g/dL — ABNORMAL LOW (ref 32.0–36.0)
MCV: 77 fL — ABNORMAL LOW (ref 80–100)
MONO ABS: 1.2 x10 3/mm — AB (ref 0.2–1.0)
Monocyte %: 11.4 %
NEUTROS PCT: 79.4 %
Neutrophil #: 8.1 10*3/uL — ABNORMAL HIGH (ref 1.4–6.5)
PLATELETS: 170 10*3/uL (ref 150–440)
RBC: 3.9 10*6/uL — ABNORMAL LOW (ref 4.40–5.90)
RDW: 19.1 % — ABNORMAL HIGH (ref 11.5–14.5)
WBC: 10.2 10*3/uL (ref 3.8–10.6)

## 2014-01-19 LAB — BASIC METABOLIC PANEL
ANION GAP: 10 (ref 7–16)
BUN: 66 mg/dL — AB (ref 7–18)
CHLORIDE: 93 mmol/L — AB (ref 98–107)
CREATININE: 2.49 mg/dL — AB (ref 0.60–1.30)
Calcium, Total: 8.4 mg/dL — ABNORMAL LOW (ref 8.5–10.1)
Co2: 33 mmol/L — ABNORMAL HIGH (ref 21–32)
GFR CALC AF AMER: 33 — AB
GFR CALC NON AF AMER: 27 — AB
GLUCOSE: 159 mg/dL — AB (ref 65–99)
OSMOLALITY: 294 (ref 275–301)
Potassium: 4.1 mmol/L (ref 3.5–5.1)
Sodium: 136 mmol/L (ref 136–145)

## 2014-01-19 LAB — HEMOGLOBIN: HGB: 8.6 g/dL — AB (ref 13.0–18.0)

## 2014-01-19 LAB — MAGNESIUM: Magnesium: 2.2 mg/dL

## 2014-01-20 LAB — CBC WITH DIFFERENTIAL/PLATELET
BASOS PCT: 0.1 %
Basophil #: 0 10*3/uL (ref 0.0–0.1)
EOS ABS: 0 10*3/uL (ref 0.0–0.7)
EOS PCT: 0 %
HCT: 25.3 % — AB (ref 40.0–52.0)
HGB: 7.9 g/dL — ABNORMAL LOW (ref 13.0–18.0)
Lymphocyte #: 0.4 10*3/uL — ABNORMAL LOW (ref 1.0–3.6)
Lymphocyte %: 4.4 %
MCH: 23.6 pg — ABNORMAL LOW (ref 26.0–34.0)
MCHC: 31.1 g/dL — ABNORMAL LOW (ref 32.0–36.0)
MCV: 76 fL — AB (ref 80–100)
MONO ABS: 0.6 x10 3/mm (ref 0.2–1.0)
Monocyte %: 7.4 %
NEUTROS ABS: 7.5 10*3/uL — AB (ref 1.4–6.5)
Neutrophil %: 88.1 %
Platelet: 158 10*3/uL (ref 150–440)
RBC: 3.33 10*6/uL — AB (ref 4.40–5.90)
RDW: 19.2 % — ABNORMAL HIGH (ref 11.5–14.5)
WBC: 8.5 10*3/uL (ref 3.8–10.6)

## 2014-01-20 LAB — BASIC METABOLIC PANEL
Anion Gap: 7 (ref 7–16)
BUN: 64 mg/dL — AB (ref 7–18)
CALCIUM: 8.2 mg/dL — AB (ref 8.5–10.1)
CO2: 33 mmol/L — AB (ref 21–32)
CREATININE: 2.73 mg/dL — AB (ref 0.60–1.30)
Chloride: 96 mmol/L — ABNORMAL LOW (ref 98–107)
EGFR (Non-African Amer.): 24 — ABNORMAL LOW
GFR CALC AF AMER: 30 — AB
Glucose: 179 mg/dL — ABNORMAL HIGH (ref 65–99)
Osmolality: 295 (ref 275–301)
POTASSIUM: 4.4 mmol/L (ref 3.5–5.1)
SODIUM: 136 mmol/L (ref 136–145)

## 2014-01-21 LAB — CBC WITH DIFFERENTIAL/PLATELET
BASOS PCT: 0.1 %
Basophil #: 0 10*3/uL (ref 0.0–0.1)
Basophil #: 0 10*3/uL (ref 0.0–0.1)
Basophil %: 0 %
EOS ABS: 0 10*3/uL (ref 0.0–0.7)
EOS PCT: 0.1 %
Eosinophil #: 0 10*3/uL (ref 0.0–0.7)
Eosinophil %: 0 %
HCT: 28.2 % — AB (ref 40.0–52.0)
HCT: 28.1 % — AB (ref 40.0–52.0)
HGB: 8.7 g/dL — ABNORMAL LOW (ref 13.0–18.0)
LYMPHS ABS: 0.3 10*3/uL — AB (ref 1.0–3.6)
LYMPHS PCT: 2.7 %
Lymphocyte #: 0.4 10*3/uL — ABNORMAL LOW (ref 1.0–3.6)
Lymphocyte %: 3.6 %
MCH: 24 pg — ABNORMAL LOW (ref 26.0–34.0)
MCH: 23.7 pg — ABNORMAL LOW (ref 26.0–34.0)
MCHC: 31.2 g/dL — ABNORMAL LOW (ref 32.0–36.0)
MCHC: 30.9 g/dL — ABNORMAL LOW (ref 32.0–36.0)
MCV: 77 fL — AB (ref 80–100)
MCV: 77 fL — AB (ref 80–100)
MONO ABS: 0.4 x10 3/mm (ref 0.2–1.0)
MONOS PCT: 12.6 %
Monocyte #: 1.4 x10 3/mm — ABNORMAL HIGH (ref 0.2–1.0)
Monocyte %: 4 %
NEUTROS ABS: 9.4 10*3/uL — AB (ref 1.4–6.5)
NEUTROS ABS: 9.3 10*3/uL — AB (ref 1.4–6.5)
Neutrophil %: 93.3 %
Neutrophil %: 83.6 %
PLATELETS: 180 10*3/uL (ref 150–440)
Platelet: 167 10*3/uL (ref 150–440)
RBC: 3.67 10*6/uL — AB (ref 4.40–5.90)
RBC: 3.67 10*6/uL — AB (ref 4.40–5.90)
RDW: 19.7 % — AB (ref 11.5–14.5)
RDW: 19.6 % — ABNORMAL HIGH (ref 11.5–14.5)
WBC: 10.1 10*3/uL (ref 3.8–10.6)
WBC: 11.2 10*3/uL — ABNORMAL HIGH (ref 3.8–10.6)

## 2014-01-21 LAB — BASIC METABOLIC PANEL
ANION GAP: 10 (ref 7–16)
BUN: 76 mg/dL — ABNORMAL HIGH (ref 7–18)
CO2: 32 mmol/L (ref 21–32)
Calcium, Total: 8.6 mg/dL (ref 8.5–10.1)
Chloride: 92 mmol/L — ABNORMAL LOW (ref 98–107)
Creatinine: 2.43 mg/dL — ABNORMAL HIGH (ref 0.60–1.30)
EGFR (Non-African Amer.): 28 — ABNORMAL LOW
GFR CALC AF AMER: 34 — AB
Glucose: 247 mg/dL — ABNORMAL HIGH (ref 65–99)
Osmolality: 299 (ref 275–301)
POTASSIUM: 4.4 mmol/L (ref 3.5–5.1)
Sodium: 134 mmol/L — ABNORMAL LOW (ref 136–145)

## 2014-01-21 LAB — HEMOGLOBIN: HGB: 8.8 g/dL — ABNORMAL LOW (ref 13.0–18.0)

## 2014-01-22 LAB — BASIC METABOLIC PANEL
ANION GAP: 8 (ref 7–16)
BUN: 72 mg/dL — AB (ref 7–18)
CO2: 32 mmol/L (ref 21–32)
Calcium, Total: 9.1 mg/dL (ref 8.5–10.1)
Chloride: 94 mmol/L — ABNORMAL LOW (ref 98–107)
Creatinine: 2.03 mg/dL — ABNORMAL HIGH (ref 0.60–1.30)
EGFR (African American): 42 — ABNORMAL LOW
EGFR (Non-African Amer.): 34 — ABNORMAL LOW
Glucose: 219 mg/dL — ABNORMAL HIGH (ref 65–99)
OSMOLALITY: 296 (ref 275–301)
POTASSIUM: 4.3 mmol/L (ref 3.5–5.1)
SODIUM: 134 mmol/L — AB (ref 136–145)

## 2014-01-22 LAB — PROTIME-INR
INR: 1.1
Prothrombin Time: 14.3 secs (ref 11.5–14.7)

## 2014-01-22 LAB — HEMOGLOBIN: HGB: 8.7 g/dL — AB (ref 13.0–18.0)

## 2014-01-23 LAB — RENAL FUNCTION PANEL
ALBUMIN: 2.5 g/dL — AB (ref 3.4–5.0)
Anion Gap: 6 — ABNORMAL LOW (ref 7–16)
BUN: 68 mg/dL — ABNORMAL HIGH (ref 7–18)
CALCIUM: 9.3 mg/dL (ref 8.5–10.1)
CREATININE: 1.97 mg/dL — AB (ref 0.60–1.30)
Chloride: 95 mmol/L — ABNORMAL LOW (ref 98–107)
Co2: 34 mmol/L — ABNORMAL HIGH (ref 21–32)
EGFR (African American): 43 — ABNORMAL LOW
EGFR (Non-African Amer.): 36 — ABNORMAL LOW
Glucose: 161 mg/dL — ABNORMAL HIGH (ref 65–99)
OSMOLALITY: 293 (ref 275–301)
Phosphorus: 2.8 mg/dL (ref 2.5–4.9)
Potassium: 4.6 mmol/L (ref 3.5–5.1)
SODIUM: 135 mmol/L — AB (ref 136–145)

## 2014-01-23 LAB — PATHOLOGY REPORT

## 2014-01-24 ENCOUNTER — Encounter: Payer: Self-pay | Admitting: Internal Medicine

## 2014-02-02 ENCOUNTER — Encounter: Payer: Self-pay | Admitting: Internal Medicine

## 2014-02-12 LAB — URINALYSIS, COMPLETE
Bacteria: NONE SEEN
Bilirubin,UR: NEGATIVE
GLUCOSE, UR: NEGATIVE mg/dL (ref 0–75)
Ketone: NEGATIVE
Nitrite: NEGATIVE
PH: 5 (ref 4.5–8.0)
Protein: 100
Specific Gravity: 1.013 (ref 1.003–1.030)

## 2014-02-14 LAB — URINE CULTURE

## 2014-03-04 ENCOUNTER — Encounter: Payer: Self-pay | Admitting: Internal Medicine

## 2014-03-05 ENCOUNTER — Encounter: Payer: Self-pay | Admitting: Surgery

## 2014-03-11 LAB — WOUND AEROBIC CULTURE

## 2014-03-12 ENCOUNTER — Ambulatory Visit: Payer: Self-pay | Admitting: Surgery

## 2014-03-18 ENCOUNTER — Inpatient Hospital Stay: Payer: Self-pay | Admitting: Internal Medicine

## 2014-03-18 LAB — CBC WITH DIFFERENTIAL/PLATELET
BASOS PCT: 0.8 %
Basophil #: 0.1 10*3/uL (ref 0.0–0.1)
Eosinophil #: 0.1 10*3/uL (ref 0.0–0.7)
Eosinophil %: 0.6 %
HCT: 33.5 % — ABNORMAL LOW (ref 40.0–52.0)
HGB: 10.2 g/dL — ABNORMAL LOW (ref 13.0–18.0)
LYMPHS ABS: 1.4 10*3/uL (ref 1.0–3.6)
LYMPHS PCT: 16.2 %
MCH: 24.7 pg — ABNORMAL LOW (ref 26.0–34.0)
MCHC: 30.5 g/dL — ABNORMAL LOW (ref 32.0–36.0)
MCV: 81 fL (ref 80–100)
MONOS PCT: 8.9 %
Monocyte #: 0.8 x10 3/mm (ref 0.2–1.0)
NEUTROS PCT: 73.5 %
Neutrophil #: 6.6 10*3/uL — ABNORMAL HIGH (ref 1.4–6.5)
Platelet: 192 10*3/uL (ref 150–440)
RBC: 4.12 10*6/uL — ABNORMAL LOW (ref 4.40–5.90)
RDW: 21.8 % — ABNORMAL HIGH (ref 11.5–14.5)
WBC: 8.9 10*3/uL (ref 3.8–10.6)

## 2014-03-18 LAB — URINALYSIS, COMPLETE
Bacteria: NONE SEEN
Bilirubin,UR: NEGATIVE
Blood: NEGATIVE
GLUCOSE, UR: NEGATIVE mg/dL (ref 0–75)
KETONE: NEGATIVE
LEUKOCYTE ESTERASE: NEGATIVE
NITRITE: NEGATIVE
PH: 7 (ref 4.5–8.0)
RBC,UR: NONE SEEN /HPF (ref 0–5)
Specific Gravity: 1.009 (ref 1.003–1.030)
Squamous Epithelial: <1
WBC UR: <1 /HPF (ref 0–5)

## 2014-03-18 LAB — BASIC METABOLIC PANEL
ANION GAP: 9 (ref 7–16)
BUN: 52 mg/dL — ABNORMAL HIGH (ref 7–18)
CREATININE: 1.89 mg/dL — AB (ref 0.60–1.30)
Calcium, Total: 9 mg/dL (ref 8.5–10.1)
Chloride: 100 mmol/L (ref 98–107)
Co2: 30 mmol/L (ref 21–32)
EGFR (African American): 45 — ABNORMAL LOW
EGFR (Non-African Amer.): 37 — ABNORMAL LOW
Glucose: 176 mg/dL — ABNORMAL HIGH (ref 65–99)
Osmolality: 296 (ref 275–301)
Potassium: 4.1 mmol/L (ref 3.5–5.1)
Sodium: 139 mmol/L (ref 136–145)

## 2014-03-18 LAB — TROPONIN I
Troponin-I: 0.02 ng/mL
Troponin-I: 0.02 ng/mL
Troponin-I: 0.03 ng/mL

## 2014-03-18 LAB — CK-MB
CK-MB: 2.5 ng/mL (ref 0.5–3.6)
CK-MB: 2.5 ng/mL (ref 0.5–3.6)
CK-MB: 2.9 ng/mL (ref 0.5–3.6)

## 2014-03-21 ENCOUNTER — Ambulatory Visit: Payer: Self-pay | Admitting: Vascular Surgery

## 2014-04-04 ENCOUNTER — Encounter: Payer: Self-pay | Admitting: Internal Medicine

## 2014-04-04 ENCOUNTER — Encounter: Payer: Self-pay | Admitting: Surgery

## 2014-04-20 LAB — WOUND AEROBIC CULTURE

## 2014-04-23 ENCOUNTER — Ambulatory Visit: Payer: Self-pay | Admitting: Surgery

## 2014-05-05 ENCOUNTER — Ambulatory Visit: Payer: Self-pay | Admitting: Internal Medicine

## 2014-05-05 ENCOUNTER — Encounter: Payer: Self-pay | Admitting: Surgery

## 2014-05-07 ENCOUNTER — Inpatient Hospital Stay: Payer: Self-pay | Admitting: Internal Medicine

## 2014-05-07 LAB — CK TOTAL AND CKMB (NOT AT ARMC)
CK, Total: 281 U/L
CK-MB: 5.3 ng/mL — ABNORMAL HIGH

## 2014-05-07 LAB — COMPREHENSIVE METABOLIC PANEL
Albumin: 2.4 g/dL — ABNORMAL LOW (ref 3.4–5.0)
Alkaline Phosphatase: 92 U/L (ref 46–116)
Anion Gap: 7 (ref 7–16)
BILIRUBIN TOTAL: 0.7 mg/dL (ref 0.2–1.0)
BUN: 54 mg/dL — AB (ref 7–18)
CHLORIDE: 95 mmol/L — AB (ref 98–107)
CREATININE: 2.06 mg/dL — AB (ref 0.60–1.30)
Calcium, Total: 9 mg/dL (ref 8.5–10.1)
Co2: 30 mmol/L (ref 21–32)
EGFR (African American): 41 — ABNORMAL LOW
EGFR (Non-African Amer.): 34 — ABNORMAL LOW
GLUCOSE: 243 mg/dL — AB (ref 65–99)
Osmolality: 287 (ref 275–301)
POTASSIUM: 5.2 mmol/L — AB (ref 3.5–5.1)
SGOT(AST): 41 U/L — ABNORMAL HIGH (ref 15–37)
SGPT (ALT): 25 U/L (ref 14–63)
Sodium: 132 mmol/L — ABNORMAL LOW (ref 136–145)
TOTAL PROTEIN: 6.3 g/dL — AB (ref 6.4–8.2)

## 2014-05-07 LAB — CBC
HCT: 35 % — ABNORMAL LOW
HGB: 10.7 g/dL — ABNORMAL LOW
MCH: 24.5 pg — ABNORMAL LOW
MCHC: 30.6 g/dL — ABNORMAL LOW
MCV: 80 fL
Platelet: 198 x10 3/mm 3
RBC: 4.37 x10 6/mm 3 — ABNORMAL LOW
RDW: 20.9 % — ABNORMAL HIGH
WBC: 9.7 x10 3/mm 3

## 2014-05-07 LAB — TROPONIN I
Troponin-I: 0.03 ng/mL
Troponin-I: 0.04 ng/mL
Troponin-I: 0.03 ng/mL

## 2014-05-07 LAB — PROTIME-INR
INR: 1.4
Prothrombin Time: 17.1 s — ABNORMAL HIGH

## 2014-05-07 LAB — PRO B NATRIURETIC PEPTIDE: B-Type Natriuretic Peptide: 30020 pg/mL — ABNORMAL HIGH

## 2014-05-08 LAB — BASIC METABOLIC PANEL
ANION GAP: 8 (ref 7–16)
BUN: 53 mg/dL — AB (ref 7–18)
CHLORIDE: 96 mmol/L — AB (ref 98–107)
CREATININE: 1.9 mg/dL — AB (ref 0.60–1.30)
Calcium, Total: 9.3 mg/dL (ref 8.5–10.1)
Co2: 28 mmol/L (ref 21–32)
GFR CALC AF AMER: 45 — AB
GFR CALC NON AF AMER: 37 — AB
Glucose: 251 mg/dL — ABNORMAL HIGH (ref 65–99)
Osmolality: 287 (ref 275–301)
POTASSIUM: 4.8 mmol/L (ref 3.5–5.1)
SODIUM: 132 mmol/L — AB (ref 136–145)

## 2014-05-08 LAB — CBC WITH DIFFERENTIAL/PLATELET
Basophil #: 0 10*3/uL (ref 0.0–0.1)
Basophil %: 0.1 %
EOS ABS: 0 10*3/uL (ref 0.0–0.7)
Eosinophil %: 0.1 %
HCT: 33 % — AB (ref 40.0–52.0)
HGB: 10.3 g/dL — AB (ref 13.0–18.0)
Lymphocyte #: 0.6 10*3/uL — ABNORMAL LOW (ref 1.0–3.6)
Lymphocyte %: 8.5 %
MCH: 24.7 pg — AB (ref 26.0–34.0)
MCHC: 31.2 g/dL — AB (ref 32.0–36.0)
MCV: 79 fL — ABNORMAL LOW (ref 80–100)
Monocyte #: 0.2 x10 3/mm (ref 0.2–1.0)
Monocyte %: 2.2 %
Neutrophil #: 6.4 10*3/uL (ref 1.4–6.5)
Neutrophil %: 89.1 %
Platelet: 180 10*3/uL (ref 150–440)
RBC: 4.17 10*6/uL — ABNORMAL LOW (ref 4.40–5.90)
RDW: 20.8 % — ABNORMAL HIGH (ref 11.5–14.5)
WBC: 7.2 10*3/uL (ref 3.8–10.6)

## 2014-05-08 LAB — MAGNESIUM: MAGNESIUM: 2.4 mg/dL

## 2014-05-09 LAB — BASIC METABOLIC PANEL
ANION GAP: 6 — AB (ref 7–16)
BUN: 58 mg/dL — AB (ref 7–18)
CO2: 30 mmol/L (ref 21–32)
CREATININE: 1.95 mg/dL — AB (ref 0.60–1.30)
Calcium, Total: 9 mg/dL (ref 8.5–10.1)
Chloride: 96 mmol/L — ABNORMAL LOW (ref 98–107)
EGFR (African American): 44 — ABNORMAL LOW
EGFR (Non-African Amer.): 36 — ABNORMAL LOW
Glucose: 121 mg/dL — ABNORMAL HIGH (ref 65–99)
Osmolality: 282 (ref 275–301)
POTASSIUM: 5.1 mmol/L (ref 3.5–5.1)
SODIUM: 132 mmol/L — AB (ref 136–145)

## 2014-05-09 LAB — CBC WITH DIFFERENTIAL/PLATELET
BASOS ABS: 0 10*3/uL (ref 0.0–0.1)
BASOS PCT: 0.3 %
EOS ABS: 0 10*3/uL (ref 0.0–0.7)
Eosinophil %: 0.2 %
HCT: 33.3 % — ABNORMAL LOW (ref 40.0–52.0)
HGB: 10.2 g/dL — AB (ref 13.0–18.0)
LYMPHS PCT: 3.2 %
Lymphocyte #: 0.5 10*3/uL — ABNORMAL LOW (ref 1.0–3.6)
MCH: 23.9 pg — ABNORMAL LOW (ref 26.0–34.0)
MCHC: 30.5 g/dL — ABNORMAL LOW (ref 32.0–36.0)
MCV: 79 fL — ABNORMAL LOW (ref 80–100)
Monocyte #: 0.2 x10 3/mm (ref 0.2–1.0)
Monocyte %: 1.8 %
NEUTROS ABS: 13.3 10*3/uL — AB (ref 1.4–6.5)
NEUTROS PCT: 94.5 %
Platelet: 225 10*3/uL (ref 150–440)
RBC: 4.24 10*6/uL — ABNORMAL LOW (ref 4.40–5.90)
RDW: 20.1 % — ABNORMAL HIGH (ref 11.5–14.5)
WBC: 14.1 10*3/uL — AB (ref 3.8–10.6)

## 2014-05-10 LAB — CBC WITH DIFFERENTIAL/PLATELET
BASOS PCT: 0.1 %
Basophil #: 0 10*3/uL (ref 0.0–0.1)
Eosinophil #: 0 10*3/uL (ref 0.0–0.7)
Eosinophil %: 0 %
HCT: 31.7 % — ABNORMAL LOW (ref 40.0–52.0)
HGB: 9.9 g/dL — AB (ref 13.0–18.0)
Lymphocyte #: 0.8 10*3/uL — ABNORMAL LOW (ref 1.0–3.6)
Lymphocyte %: 9.5 %
MCH: 24.6 pg — ABNORMAL LOW (ref 26.0–34.0)
MCHC: 31.2 g/dL — AB (ref 32.0–36.0)
MCV: 79 fL — ABNORMAL LOW (ref 80–100)
MONO ABS: 0.7 x10 3/mm (ref 0.2–1.0)
Monocyte %: 8.7 %
NEUTROS PCT: 81.7 %
Neutrophil #: 6.4 10*3/uL (ref 1.4–6.5)
Platelet: 224 10*3/uL (ref 150–440)
RBC: 4.02 10*6/uL — ABNORMAL LOW (ref 4.40–5.90)
RDW: 20.6 % — AB (ref 11.5–14.5)
WBC: 7.9 10*3/uL (ref 3.8–10.6)

## 2014-05-10 LAB — BASIC METABOLIC PANEL
Anion Gap: 5 — ABNORMAL LOW (ref 7–16)
BUN: 64 mg/dL — AB (ref 7–18)
CALCIUM: 9 mg/dL (ref 8.5–10.1)
CHLORIDE: 94 mmol/L — AB (ref 98–107)
CREATININE: 1.97 mg/dL — AB (ref 0.60–1.30)
Co2: 33 mmol/L — ABNORMAL HIGH (ref 21–32)
GFR CALC AF AMER: 43 — AB
GFR CALC NON AF AMER: 36 — AB
GLUCOSE: 180 mg/dL — AB (ref 65–99)
OSMOLALITY: 287 (ref 275–301)
POTASSIUM: 4.6 mmol/L (ref 3.5–5.1)
SODIUM: 132 mmol/L — AB (ref 136–145)

## 2014-05-10 LAB — HEMOGLOBIN A1C: Hemoglobin A1C: 7.8 % — ABNORMAL HIGH (ref 4.2–6.3)

## 2014-06-03 ENCOUNTER — Encounter
Admit: 2014-06-03 | Disposition: A | Discharge status: Home / Self Care | Payer: Self-pay | Attending: Surgery | Admitting: Surgery

## 2014-06-03 ENCOUNTER — Ambulatory Visit
Admit: 2014-06-03 | Disposition: A | Discharge status: Home / Self Care | Payer: Self-pay | Attending: Internal Medicine | Admitting: Internal Medicine

## 2014-07-04 ENCOUNTER — Encounter
Admit: 2014-07-04 | Disposition: A | Discharge status: Home / Self Care | Payer: Self-pay | Attending: Surgery | Admitting: Surgery

## 2014-07-04 DEATH — deceased

## 2014-07-22 NOTE — Consult Note (Signed)
PATIENT NAME:  Larry Rice, Larry Rice MR#:  [id number] DATE OF BIRTH:  1940/08/25  DATE OF CONSULTATION:  10/30/2011  REFERRING PHYSICIAN:   CONSULTING PHYSICIAN:  Yevonne PaxSaadat A. Khan, MD  REASON FOR CONSULTATION: Acute respiratory failure.  HISTORY OF PRESENT ILLNESS: The patient is a 74 year old gentleman who was admitted on July 24th. He came in with multiple medical problems including cardiomyopathy, peripheral vascular disease, diabetes, coronary artery disease and he presented for aortobifemoral bypass graft. The patient had the procedure and was apparently doing well but yesterday he had V. fib arrest and was CODED for approximately 45 minutes, shocked about 20 times, and when the CODE was eventually called the patient apparently recovered rhythm. He is now orally intubated. He is on the ventilator and is unresponsive.   PAST MEDICAL HISTORY: 1. Coronary artery disease. 2. CKD, stage III. 3. COPD. 4. Hyperlipidemia. 5. Chronic atrial fibrillation. 6. Peripheral vascular disease.  ALLERGIES: Negative.  SOCIAL HISTORY: Negative for tobacco and alcohol at present. He is a former smoker.   FAMILY HISTORY: Positive for diabetes, hypertension, and coronary artery disease.  PAST SURGICAL HISTORY: 1. Cholecystectomy. 2. Carpal tunnel surgery.   REVIEW OF SYSTEMS: He is not able to provide.    PHYSICAL EXAMINATION:   VITAL SIGNS: At the time he is seen his temperature is about 100.5, pulse 98, respiratory rate 23, blood pressure 143/69, saturations 99%.  NECK: Basically supple. No JVD. No adenopathy.   HEENT: Could not assess extraocular movements.  CHEST: Coarse breath sounds with few distant rhonchi. No rales. Expansion was equal.   CARDIOVASCULAR: S1, S2 normal, regular rhythm. No gallop or rub.  ABDOMEN: Soft, nontender.  EXTREMITIES: Without cyanosis or clubbing. Pulses equal. No edema.  LABORATORY DATA: White count 14.3, hemoglobin 14.7, hematocrit 44.6. Blood gas was drawn  and was 7.48/35/155. He had a chest x-ray done overnight which showed no acute infiltrates.  IMPRESSION: 1. Acute respiratory failure. 2. Status post cardiac arrest.  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 At this time it's a little bit too soon to see if there's going to be any kind of meaningful neurological recovery. He was not started on hypothermia protocol due to multiple medical issues. The patient's family was at the bedside and we discussed in detail his overall prognosis which is fairly poor. I would suggest getting a Palliative Care consult to discuss options with the family. Will continue with other meds and therapy and supportive care.  PROGNOSIS OVERALL: Quite guarded.    ____________________________ Yevonne PaxSaadat A. Khan, MD sak:drc D: 10/30/2011 13:49:52 ET T: 10/30/2011 14:36:53 ET JOB#: [id number] cc: Yevonne PaxSaadat A. Khan, MD, <Dictator> Yevonne PaxSAADAT A KHAN MD ELECTRONICALLY SIGNED 11/09/2011 11:46

## 2014-07-22 NOTE — Op Note (Signed)
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   PROCEDURES PERFORMED:  1. Insertion of right IJ triple lumen catheter with ultrasound guidance.  2. Exploratory laparotomy.   SURGEON: Levora Dredge, MD  FIRST ASSISTANT: Festus Barren, MD  ANESTHESIA: General by endotracheal intubation.   FLUIDS: Per anesthesia record.   ESTIMATED BLOOD LOSS: 100 mL.   SPECIMEN: None.   INDICATIONS: Larry Rice is a 74 year old gentleman who presented with worsening pain of his right lower extremity and difficulty appreciating Doppler signals or palpable pulses. Noninvasive studies confirmed significant atherosclerotic occlusive disease. Angiography obtained suggested severe aortoiliac stenosis. It was therefore recommended he undergo aortobifemoral bypass grafting as he also had profound common femoral artery disease bilaterally.   The risks and benefits as well as alternative therapies were reviewed with the patient and he has agreed to proceed with laparotomy with the intention for aortobifemoral bypass grafting.   DESCRIPTION OF PROCEDURE: The patient is taken to the Operating Room and placed in the supine position. After adequate general anesthesia is induced and appropriate invasive monitors are placed his right neck is prepped and draped in sterile fashion. Ultrasound is placed in a sterile sleeve. Jugular vein is identified. It is echolucent, homogeneous indicating patency. Image is recorded for the permanent record. Under direct ultrasound visualization, a Seldinger needle is inserted into the jugular vein, J-wire is advanced, and a counterincision followed by the dilator  and triple-lumen is fed over the wire without difficulty. All three lumens aspirate and flush easily. The catheter is secured to the skin of the neck with 2-0 silk and a sterile dressing is applied. The patient tolerated the procedure well.   The patient is then adjusted slightly in positioning and prepped from his nipple line to his knees.   Laparotomy is then performed, a midline incision created and carried down through the soft tissues controlling hemostasis with Bovie cautery. The fascia is then incised and the abdominal cavity opened. Viscera is examined and there are no obvious abnormalities. The Omni tract is then placed and the small intestines reflected into the right gutter. Colon is delivered up superiorly onto the upper abdomen and chest area. The sigmoid is then packed into the left gutter. A midline incision is then made with Bovie cautery through the retroperitoneal tissues and the dissection is carried down to expose the aorta. The aorta is then examined. It is found to have a significant amount of plaque and to be circumferentially calcified and not clampable in its inferior aspect. Therefore, the dissection is carried more proximally. The left renal vein is encountered. It appears to be sclerotic. It is ligated between 2-0 Ethibond sutures and then divided. No significant lumen is identified on the cut end and there was no bleeding from the vein during the transection. There appears to be two renal arteries on the left. Both renal arteries on the left are densely calcified. In a similar fashion, there is one renal artery on the right, but it is densely calcified. Palpation of the aorta with near circumferential dissection demonstrates that even at the suprarenal level the aorta is not clampable. It does become softer but this appears to be at the level that is above the mesenteric vessels  and would given his comorbidities add or contribute significantly to the morbidity of any continued  surgery.   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 Therefore, the retroperitoneum was closed with running 0 Vicryl, viscera were returned to their anatomic location, and the midline incision was closed with looped PDS interspersed with [place] knots. Skin was closed with staples. The patient was subsequently transported to the recovery area in stable condition and when he is suitably recovered will undergo stenting of the    iliac and subsequently be scheduled for femoral to femoral bypass grafting with bilateral femoral endarterectomies. This decision was then conveyed in a family meeting so that all would have a clear understanding of the change in course of action. All questions were answered. ____________________________ Renford DillsGregory G. Antolin Belsito, MD ggs:slb D: 10/26/2011 15:10:20 ET T: 10/26/2011 15:45:32 ET JOB#: [id number]  cc: Renford DillsGregory G. Herman Mell, MD, <Dictator> Steele SizerMark A. Crissman, MD Renford DillsGREGORY G Makar Slatter MD ELECTRONICALLY SIGNED 10/27/2011 12:26

## 2014-07-22 NOTE — Consult Note (Signed)
PATIENT NAME:  Larry Rice, Larry Rice MR#:  [id number] DATE OF BIRTH:  08-Nov-1940  DATE OF CONSULTATION:  10/27/2011  REFERRING PHYSICIAN:  Levora DredgeGregory Schnier, MD CONSULTING PHYSICIAN:  Prabhleen Montemayor P. Juliene PinaMody, MD  PRIMARY CARE PHYSICIAN: Vonita MossMark Crissman, MD  PRIMARY CARDIOLOGIST: Harold HedgeKenneth Fath, MD  REASON FOR CONSULTATION: Hypertension.   IMPRESSION:  1. 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  2. History of cardiomyopathy, ejection fraction 27% by sestamibi on 10/15/2011 with abnormal myocardial perfusion imaging.  3. History of diabetes.  4. History of coronary artery disease.  5. History of chronic kidney failure, stage III.  6. History of chronic obstructive pulmonary disease.  7. Hyperlipidemia.  8. Probable right lower extremity cellulitis.  9. History of atrial fibrillation, on Coumadin.  10. Severe peripheral vascular disease.   PLAN:  1. I would continue metoprolol 5 mg IV every six hours since the patient is n.p.o. We will also add nitroglycerin patch while the patient is n.p.o. and hydralazine p.r.n. 2. The patient has a history of atrial fibrillation on Coumadin. He is on Lovenox 40 mg twice a day. I would suggest the patient be started on heparin drip as Lovenox 40 mg twice a day is not therapeutic for this patient with a history of atrial fibrillation on Coumadin.  3. I would continue sliding scale insulin as the patient is n.p.o. and would not administer his insulin at this time but would continue to monitor his blood sugars which seem to be tolerable at this time.  4. Tachycardia likely due to withdrawal from metoprolol as the patient is not receiving his metoprolol and this was just started. Would monitor. 5. For his other medical issues would restart medications as soon as tolerated.  6. For cellulitis without recommend adding Zosyn.   HISTORY OF PRESENT ILLNESS: This is a very pleasant 74 year old male with a history of diabetes, coronary artery disease, cardiomyopathy,  and severe peripheral vascular disease who presented for an aortic bifemoral bypass graft. Apparently he is unable to get this procedure done due to extensive atherosclerotic occlusive disease of the bilateral lower extremities and the plan according to the patient is to have a stent. Hospitalist was consulted due to elevated blood pressures as blood pressures have been running 146 to 181/76 to 92. He is normally on metoprolol, Imdur, HCTZ/lisinopril, and Lasix, as well as clonidine at home.   REVIEW OF SYSTEMS: CONSTITUTIONAL: Denies any fever or chills. Positive fatigue and weakness. EYES: No blurred or double vision. ENT: No ear pain, hearing loss. Positive snoring. RESPIRATORY: Denies cough, wheezing, or hemoptysis. Positive chronic obstructive pulmonary disease, not on oxygen. CARDIOVASCULAR: Denies any chest pain, orthopnea, palpitations, or syncope. GI: No nausea, vomiting, or diarrhea. He has abdominal pain from the open laparotomy. GENITOURINARY: No dysuria or hematuria. ENDOCRINE: No polyuria or polydipsia. HEME/LYMPH: Positive anemia and easy bruising. SKIN: No rash or lesions. MUSCULOSKELETAL: He has some back pain.  He has limited activity due to severe peripheral vascular disease. NEUROLOGIC: Positive history of cerebrovascular accident. PSYCH: Positive history of depression.   PAST MEDICAL HISTORY:  1. Chronic obstructive pulmonary disease.  2. Coronary artery disease status post myocardial infarction.  3. Atrial fibrillation on Coumadin.  4. Hypertension.  5. Diabetes.  6. History of hyperlipidemia.  7. History of peripheral vascular disease.  8. History of mild depression. 9. Gout. 10. Cardiomyopathy with ejection fraction of 27% on Sestamibi performed in early July.  MEDICATIONS: 1. Advair Diskus 250/50 twice a day. 2. Allopurinol 100 mg daily. 3. Aspirin  81 mg daily.  4. Clonidine 0.1 mg daily.  5. Coumadin 6 mg daily.  6. Lasix 40 mg daily.  7. HCTZ/lisinopril 25/20 mg  daily.  8. Imdur 120 mg daily.  9. Lantus 70 units at bedtime.  10. Metoprolol 100 mg twice a day. 11. NovoLog 15 units three times daily. 12. Percocet 5/325 mg one tablet every six hours p.r.n.  13. Ranitidine 300 mg at bedtime.  14. Simvastatin 40 mg daily.  15. Effexor 37.5 mg twice a day. 16. Ventolin HFA 2 puffs four times daily p.r.n.   ALLERGIES: No known drug allergies.  SOCIAL HISTORY: No tobacco, alcohol, or drug use. The patient was a former smoker.   FAMILY HISTORY: Positive for diabetes, hypertension, and coronary artery disease.   PAST SURGICAL HISTORY:  1. Cholecystectomy.  2. Carpal tunnel surgery.   PHYSICAL EXAMINATION:   VITAL SIGNS: Temperature 98.9, pulse 106, blood pressure 146/76, and saturation 96% on 2 liters.   GENERAL: The patient is alert and oriented, not in acute distress.   HEENT: Head is atraumatic. Pupils are round and reactive. Sclerae anicteric. Mucous membranes are moist. Oropharynx is clear.   NECK: Supple without jugular venous distention, carotid bruit, or enlarged thyroid.   CARDIOVASCULAR: Tachycardia without murmur, gallops, or rubs. PMI is not displaced.   LUNGS: Clear to auscultation without crackles, rales, rhonchi, or wheezing. Normal to percussion.   ABDOMEN: Obese. Bowel sounds are positive. The patient has a bandage on his abdomen. He is slightly tender due to the recent laparotomy.   EXTREMITIES: No clubbing, cyanosis or edema.   NEUROLOGIC: Cranial nerves II through XII are grossly intact. No focal deficits.  SKIN: No rash or lesions.   MUSCULOSKELETAL: The patient is able to move all extremities.   LABORATORY, DIAGNOSTIC AND RADIOLOGIC DATA: Sodium 140, potassium 4.6, chloride 104, bicarbonate 30, BUN 36, creatinine 1.71, glucose 362, and calcium 8.7. White blood cells 15, hemoglobin 14.7, hematocrit 48, and platelets 213.   Thank you for allowing [place] to participate in the care of this patient. We will continue to  follow.   TIME SPENT: 55 minutes.  ____________________________ Janyth Contes. Juliene Pina, MD spm:slb D: 10/27/2011 13:47:52 ET T: 10/27/2011 14:30:16 ET JOB#: [id number]  cc: Nihar Klus P. Juliene Pina, MD, <Dictator> Steele Sizer, MD Janyth Contes Raziya Aveni MD ELECTRONICALLY SIGNED 10/28/2011 15:27

## 2014-07-22 NOTE — Consult Note (Signed)
No Known Allergies:     Impression malignant HTN hx a fib on coumaind CAd/recent sestambi with EF 27% and perfusion with MI DM severe PVD unable to do aortic bi fem bypass will need stent per patient LRE rednesspossible cellulits    Plan metoprol 5 mg IV q 6 as pt is NPO hydraaizine PRN NTG patch SSI consider heparin gtt t on lovenox 40 BID but given hx a fib would rec heparin gtt if able restart outpt meds when able can start zosyn for leg cellulitis as WBC elevated 15  thank you will follow   Electronic Signatures: Adrian SaranMody, Sajid Ruppert (MD)  (Signed 25-Jul-13 13:40)  Authored: Allergies, Impression/Plan   Last Updated: 25-Jul-13 13:40 by Adrian SaranMody, Fernando Torry (MD)

## 2014-07-25 NOTE — Discharge Summary (Signed)
PATIENT NAME:  Larry Rice, Larry Rice MR#:  [id number] DATE OF BIRTH:  1940-07-26  DATE OF ADMISSION:  07/27/2012 DATE OF DISCHARGE:  07/29/2012  PRESENTING COMPLAINT: Shortness of breath.   DISCHARGE DIAGNOSES: 1.  Congestive heart failure, acute systolic.  2.  Hypertension.  3.  Cardiomyopathy, ejection fraction 30% to 35%.  4.  Peripheral vascular disease.  5.  Type 2 diabetes.  6.  Hypoxemia, not requiring oxygen.   CODE STATUS: FULL CODE.   MEDICATIONS: 1.  Advair 250/50, 1 puff b.i.d.  2.  Allopurinol 100 mg daily.  3.  Aspirin 81 mg daily.  4.  Lantus 70 units at bedtime.  5.  Ranitidine 300 mg 1 p.o. daily.  6.  Metoprolol tartrate 25 mg twice a day.  7.  NovoLog FlexPen 15 units t.i.d.  8.  Gabapentin 100 mg at bedtime as needed.  9.  Atorvastatin 20 mg at bedtime.  10.  Tamsulosin 0.4 mg daily.  11.  Lisinopril 10 mg daily.  12.  Amlodipine 5 mg daily.  13.  Tylenol 500 mg 2 tablets every 6 hours as needed for pain.  14.  Cymbalta 60 mg delayed release p.o. daily.  15.  Spironolactone 25 mg daily.  16.  Lasix 20 mg every day.   Two liters nasal cannula oxygen, continuous.   FOLLOWUP:  1.  Follow up with Dr. Lady GaryFath next week.  2.  Dr. Dossie Arbourrissman in 1 to 2 weeks.   Echo Doppler showed EF of 30% to 35%, moderately- to severely-decreased global LV systolic function, mildly dilated left atrium, right atrium, mild MR.   Cardiac enzymes x 3 negative, H and H is 14 and 42.1, platelet count is 96. White count is 8.2, creatinine is 1.52, sodium is 135, potassium is 4.3. Lipid profile within normal limits.   LFTs remained stable.   Chest x-ray consistent with pulmonary edema.   B-type natriuretic peptide was 5230.   The patient is a 74 year old Caucasian gentleman with history of hypertension, diabetes, cardiomyopathy, with EF of around 30%, came to the Emergency Room with:   1.  Acute hypoxic respiratory failure due to systolic congestive heart failure: He received BiPAP,  which was then changed to nasal cannula oxygen. The patient did qualify for home oxygen. His sats dropped down into the 80s on ambulation without oxygen. He will be set up for 2 liters oxygen at home. He received IV Lasix, diuresed well, changed to p.o. Lasix. Creatinine is stable.  2.  Acute congestive heart failure, systolic: Echo showed EF of 30% to 35%. Metoprolol and lisinopril were continued. Dr. Lady GaryFath saw the patient and recommended continuing the above meds along with followup as outpatient.  3.  Hypertension: Stable.  4.  Hyperlipidemia: Continued statins.  5.  PVD: On aspirin and statins.  6.  Type 2 diabetes: Lantus and sliding-scale were given.  7.  History of a-fib, on aspirin and metoprolol: Currently in sinus rhythm.  8.  History of gout: Allopurinol was continued.   Hospital stay otherwise remained stable. The patient will be discharged to home in stable condition along with home health RN, and oxygen has been set up.   Time spent: Forty minutes.   The patient remained a FULL CODE.    ____________________________ Wylie HailSona A. Allena KatzPatel, MD sap:dm D: 07/29/2012 11:25:00 ET T: 07/29/2012 12:30:13 ET JOB#: [id number]  cc: Andretta Ergle A. Allena KatzPatel, MD, <Dictator> Steele SizerMark A. Crissman, MD Darlin PriestlyKenneth A. Lady GaryFath, MD Willow OraSONA A Aalaysia Liggins MD ELECTRONICALLY SIGNED 08/03/2012 6:56

## 2014-07-25 NOTE — H&P (Signed)
PATIENT NAME:  Larry Rice, GALI MR#:  [id number] DATE OF BIRTH:  [birth year]  DATE OF ADMISSION:  07/27/2012  PRIMARY CARE PHYSICIAN:  Dr. Dossie Arbour  CARDIOLOGIST:  Dr. Lady Gary  CHIEF COMPLAINT:  Shortness of breath.   HISTORY OF PRESENT ILLNESS: This is a 74 year old man with multiple medical issues presents to the ER with 5 to 6-day history of shortness of breath, worse today where he was just short of breath with rest. He is coughing up clear phlegm. He has some pain in the lower back and right shoulder blade. He did have some sweating today, slight chest discomfort but nothing out of the usual. His main complaint was shortness of breath. When EMS arrived as per care manager, pulse ox was 70%. The patient placed on BiPAP. In the ER, he was found to have a chest x-ray consistent with pulmonary edema and hospitalist services were contacted for further evaluation with acute respiratory failure and acute congestive heart failure.   PAST MEDICAL HISTORY: COPD, coronary artery disease, history of atrial fibrillation, hypertension, diabetes, hyperlipidemia, peripheral vascular disease, mild depression, gout, cardiomyopathy and V. tach arrest.   PAST SURGICAL HISTORY:  Cholecystectomy, required 2 procedures to do it, carpal tunnel, CABG, attempted bypass and a defibrillator.   SOCIAL HISTORY: Quit smoking 18 years ago. No alcohol. No drug use. He used to work as a Naval architect.   FAMILY HISTORY: Mother died of heart failure, had diabetes and a stroke. Father died of heart disease, had hypertension.   ALLERGIES:  No known drug allergies.   MEDICATIONS: As per prescription writer include Advair Diskus 250/50 one inhalation twice a day, allopurinol 100 mg daily, amlodipine 5 mg daily, aspirin 81 mg daily, atorvastatin 20 mg at bedtime, Cymbalta 60 mg daily, gabapentin 100 mg at bedtime, Lantus 70 units subcutaneous injection at night, lisinopril 10 mg daily, metoprolol tartrate 25 mg 1 tablet twice a day,  NovoLog FlexPen 15 units subcutaneous injection t.i.d., ranitidine 300 mg daily, Flomax 0.4 mg daily, Tylenol 1000 mg every 6 hours as needed for pain.  REVIEW OF SYSTEMS:  CONSTITUTIONAL: Positive for sweating. No fever or chills. Positive weight gain, 5 pounds. Positive for fatigue.  EYES:  He does wear glasses. EARS, NOSE, MOUTH AND THROAT:  Decreased hearing. Positive for runny nose. No sore throat. No difficulty swallowing.  CARDIOVASCULAR:  Slight chest pain.  RESPIRATORY:  Positive for shortness of breath, coughing, clear phlegm. No hemoptysis.  GASTROINTESTINAL: Positive for abdominal pain in the midepigastric area. No nausea. No vomiting. No diarrhea. Positive for constipation, hemorrhoid, blood seen with bowel movements.  GENITOURINARY:  No burning on urination. No hematuria.  MUSCULOSKELETAL:  No joint pain.  INTEGUMENT:  No rashes or eruptions.  NEUROLOGIC:  No fainting or blackouts.  PSYCHIATRIC:  Positive for anxiety and depression.  ENDOCRINE:  No thyroid problems. HEMATOLOGIC AND LYMPHATIC:  No anemia.   PHYSICAL EXAMINATION: VITAL SIGNS: Temperature 98.1, pulse 82, respirations 20, blood pressure 143/77, pulse ox 98% on BiPAP, initial pulse ox by EMS 70%.  GENERAL:  Slight respiratory distress.  EYES: Conjunctivae and lids normal. Pupils equal, round and reactive to light. Extraocular muscles intact. No nystagmus. EARS, NOSE, MOUTH AND THROAT:  Tympanic membranes: No erythema. Nasal mucosa: No erythema. Throat:  No erythema. No exudate seen. Lips and gums:  No lesions.  NECK:  Positive for JVD. No bruits. No lymphadenopathy. No thyromegaly. No thyroid nodules palpated.  RESPIRATORY: Positive use of accessory muscles to breathe. Positive rales halfway up lung field.  CARDIOVASCULAR:  S1, S2 normal. No gallops, rubs or murmurs heard. Carotid upstroke 2+ bilaterally. No bruits.  EXTREMITIES:  Dorsalis pedis pulses 1+ bilaterally, 2+ edema, bilateral lower extremity.  ABDOMEN:   Soft, nontender. No organomegaly/splenomegaly. Normoactive bowel sounds. No masses felt.  LYMPHATIC:  No lymph nodes in the neck.  MUSCULOSKELETAL:  2+ edema. No clubbing on oxygen.  No cyanosis on oxygen. SKIN:  No ulcers or lesions seen.  NEUROLOGIC:  Cranial nerves II through XII grossly intact. LOWER EXTREMITIES:  Deep tendon reflexes 2+ bilateral. PSYCHIATRIC:  The patient is oriented to person, place and time.   LABORATORY AND RADIOLOGICAL DATA:  ABG:  pH 7.37, pCO2 39, pO2 115, bicarb 22.5, O2 saturation 98.3.  Chest x-ray:  Pulmonary edema.  White blood cell count 13.1, H and H 15.5 and 48.2, platelet count 197. Glucose 205, BUN 32, creatinine 1.48, sodium 136, potassium 4.3, chloride 106, CO2 23, calcium 8.9. Liver function tests:  Alkaline phosphatase 177, ALT 155, AST 184. BNP 5230, troponin negative.   EKG: Normal sinus rhythm, nonspecific intraventricular block.   ASSESSMENT AND PLAN: 1.  Acute respiratory failure:  BiPAP in order to oxygenate. THE PATIENT IS A FULL CODE. This is due to systolic congestive heart failure.  2. Acute systolic congestive heart failure:  IV Lasix 40 mg IV b.i.d. We will get an echocardiogram to evaluate the ejection fraction. Metoprolol will be continued. Lisinopril increased to b.i.d.  Add nitro paste.  3.  Hypertension:  Blood pressure slightly elevated. Will titrate up the metoprolol and add nitro paste and the Lasix.  4.  Hyperlipidemia:  We will continue atorvastatin right now but need to watch closely with the elevated liver function tests.  5.  Peripheral vascular disease:  The patient only on aspirin at this time and statin.  6.  Diabetes:  We will decrease the dose of the Lantus overnight since the patient is on BiPAP.  7.  History of atrial fibrillation:  On aspirin and metoprolol. Currently in a sinus rhythm.  8.  Depression:  On Cymbalta.  9.  History of gout:  On allopurinol.  10.  Increased liver function tests likely secondary to  heart failure:  We will diuresis and continue to monitor her liver function tests.  I am going to continue the statin at this time, but if liver function tests worsen, may have to hold the statin.  TIME SPENT ON ADMISSION:  60 minutes critical care.    ____________________________ Herschell Dimesichard J. Renae GlossWieting, MD rjw:ce D: 07/27/2012 18:00:05 ET T: 07/27/2012 18:30:12 ET JOB#: [id number]  cc: Herschell Dimesichard J. Renae GlossWieting, MD, <Dictator> Lucia BitterMark Crissman Kenneth A. Lady GaryFath, MD   Salley ScarletICHARD J Reathel Turi MD ELECTRONICALLY SIGNED 08/04/2012 12:57

## 2014-07-25 NOTE — Consult Note (Signed)
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 He has been closely followed for his aicd and it has been functionisng normally. He is on metoprolol tartrate and lisinopril as well as amlodipine and is being treated for diabetes. He was admitted after presenting to the er with a rather acute onset of shortness ofbreath and was nted to be hypoxic by his home health provider with a sat at 70% His cxr revealed pulmoanry edema. He was admitted and diuresed and placed on oxyben. He has improved somehat and is near his baseline after diuresisng. He staes he has been compliant with his medicaitons and attempts to follwo a low sodium diet. he has not weighed recently but thisnk he is up approximately 5  poiunds.   Physical Exam:  GEN obese   HEENT PERRL, hearing intact to voice   NECK supple   RESP no use of accessory muscles  rhonchi  crackles   CARD Regular rate and rhythm  Normal, S1, S2  Murmur   Murmur Systolic   Systolic Murmur axilla   ABD denies tenderness  no hernia  normal BS   LYMPH negative neck, negative axillae   EXTR negative cyanosis/clubbing, negative edema   SKIN normal to palpation   NEURO cranial nerves intact   PSYCH alert, poor insight   Review of Systems:  Subjective/Chief Complaint shortness of breath   General: Fatigue  Weakness   Skin: No Complaints   ENT: No Complaints   Eyes: No Complaints   Neck: No Complaints   Respiratory: Short of breath   Cardiovascular: Dyspnea  Orthopnea  Edema   Gastrointestinal: No Complaints   Genitourinary: No Complaints   Vascular: No Complaints   Musculoskeletal: No Complaints   Neurologic: No Complaints   Hematologic: No Complaints   Endocrine: No Complaints   Psychiatric: No Complaints   Review of Systems: All other systems were reviewed and found to be negative   Medications/Allergies Reviewed  Medications/Allergies reviewed     CAD:    a fib:    reflux:    chronic renal insufficiency:    bladder cancer:    COPD:    cva: right reidual   chf:    Hypercholesterolemia:    Diabetes Mellitus,Type I (IDD):    htn:    cholecystectomy:    CTR:   Home Medications: Medication Instructions Status  Advair Diskus 250 mcg-50 mcg inhalation powder 1 puff(s) inhaled 2 times a day Active  allopurinol 100 mg oral tablet 1 tab(s) orally once a day in am Active  aspirin 81 mg oral delayed release tablet 1 tab(s) orally once a day in am Active  Lantus 100 units/mL subcutaneous solution 70 unit(s) subcutaneous once a day (at bedtime) Active  ranitidine 300 mg oral capsule 1 cap(s) orally once a day (with evening meal) Active  Metoprolol Tartrate 25 mg oral tablet 1 tab(s) orally 2 times a day Active  NovoLOG FlexPen 100 units/mL subcutaneous solution 15 unit(s) subcutaneous 3 times a day Active  gabapentin 100 mg oral capsule 1 cap(s) orally once a day (at bedtime), As Needed Active  atorvastatin 20 mg oral tablet 1 tab(s) orally once a day (at bedtime) Active  tamsulosin 0.4 mg oral capsule 1 cap(s) orally once a day Active  lisinopril 10 mg oral tablet 1 tab(s) orally once a day Active  amLODIPine 5 mg oral tablet 1 tab(s)  orally once a day Active  Tylenol 500 mg oral tablet 2 tab(s) orally every 6 hours, As Needed - for Pain Active  Cymbalta 60 mg oral delayed release capsule 1 cap(s) orally once a day Active   EKG:  EKG NSR   Abnormal NSSTTW changes    No Known Allergies:    Impression Pt with history of ischemic cardiomyopathy who was amitted after presenting to the er with ocmplaints of increaseing shortness of breath and note to be hypoxic. CXR revealed evidence of volume overload. He states he has been compliant with meds and denies dietary indiscretion for the most part. He does not apear to be ishemic at present and has improved with iduresis. He appears to have  had an acute exacerbation of his chronic systollic heart failure with NYHA class IV.   Plan 1 Cintiue careful diruesis 2. Will cnage form metoprolo tartrate to carvedilol 6.25 mg bid 3. Conitnue with ace inhibitor at current rate. 4. Will add spirononlactone 25 mg daily 5. Echo pending 6. Further recs pedning course.   Electronic Signatures: Dalia HeadingFath, Duncan Alejandro A (MD)  (Signed 26-Apr-14 17:07)  Authored: General Aspect/Present Illness, History and Physical Exam, Review of System, Past Medical History, Home Medications, EKG , Allergies, Impression/Plan   Last Updated: 26-Apr-14 17:07 by Dalia HeadingFath, Sabryna Lahm A (MD)

## 2014-07-26 NOTE — Consult Note (Signed)
Patient had bleeding from right anterior nasal septum requiring cautery and packing. Should be kept on Keflex (or any other antistaphylococcal prophylaxis if ortho needs antibiotic coverage post surgery) as long as packing is in place. Packing will stay in place for 3-5 days, depending on any breakthrough bleeding and anticoagulant use postop.  Electronic Signatures: Sandi MealyBennett, Altair Stanko S (MD)  (Signed on 17-Oct-15 09:50)  Authored  Last Updated: 17-Oct-15 09:50 by Sandi MealyBennett, Chanin Frumkin S (MD)

## 2014-07-26 NOTE — Consult Note (Signed)
Brief Consult Note: Diagnosis: Displaced right femoral neck fracture.   Patient was seen by consultant.   Recommend to proceed with surgery or procedure.   Recommend further assessment or treatment.   Orders entered.   Discussed with Attending MD.   Comments: 74 y/o male fell out of bed yesterday AM while at casino in [place]  Seen at the Emergency Room in [place] and transferred [facility] late last night after conferring with the medical service here. X-rays show a displaced right femoral neck fracture.  He has congestive heart failure, Diabetes, atrial fibrillation with Plavix treatment , history of stroke with right hemiplegia, CABG, and probable dehydration according to renal tests.  Also noted to be on continuous O2 at home.  Discussed case with Dr Kristine GarbeSuddini and family. Because of the Plavix and current congestive heart failure , surgery should be delayed until at least tomorrow if not Monday.  Explained this to the family who understand.  Also, he will need hemiarthorplasty for this fracture and will need general anesthesia. Risks and benefits of surgery were discussed at length including but not limited to infection, non union, nerve or blood vessed damage, non union, need for repeat surgery, blood clots and lung emboli, and death.   Exam:  Alert and comfortable.  Pain with range of motion right leg.  circulation/sensation/motor function good.  Abraion over right knee.  Leg rotated and short.  No other injuries noted.   X-rays; as above  Dx: displaced right femoral neck fracture   Rx:  plan hemiarthroplasty when cleared medically.  Electronic Signatures: Valinda HoarMiller, Geremy Rister E (MD)  (Signed 17-Oct-15 13:21)  Authored: Brief Consult Note   Last Updated: 17-Oct-15 13:21 by Valinda HoarMiller, Karisa Nesser E (MD)

## 2014-07-26 NOTE — Consult Note (Signed)
PATIENT NAME:  Larry Rice, [name] MR#:  [id number] DATE OF BIRTH:  [birth year]  DATE OF CONSULTATION:  01/18/2014  REFERRING PHYSICIAN:  Dr. Elpidio AnisSudini CONSULTING PHYSICIAN:  Ollen GrossPaul S. Willeen CassBennett, MD  [facility], AND TH[facility]AT CONSULTATION NOTE   CONSULTING PHYSICIAN: Anthoney HaradaPaul Scott Analiya Porco, MD   REASON FOR CONSULTATION: Epistaxis.   HISTORY OF PRESENT ILLNESS: A 74 year old male was admitted to the hospital for a right hip fracture. Apparently, the patient was [place] The patient has a history of hypertension, COPD, and congestive heart failure, and was put on nasal cannula oxygen, and later may have picked at his nose and developed bleeding from the right side of the nose, which was difficult to control. Afrin nasal spray temporarily slowed the bleeding, but he was still having some active bleeding, and therefore I was consulted.   PAST MEDICAL HISTORY: Hypertension, COPD, coronary artery disease status post CABG, history of diabetes mellitus on insulin, history of CKD stage III, history of stroke with right hemiparesis status post left carotid endarterectomy in July 2015, hyperlipidemia, history of congestive heart failure, peripheral artery disease, status post surgical intervention, history of bladder cancer status post surgery 7 years ago.   PAST SURGICAL HISTORY: Cholecystectomy, coronary artery bypass surgery, prior bladder surgery, left carotid endarterectomy.   ALLERGIES: None.   HOME MEDICATIONS: Anoro Ellipta inhalation 6.25/25 mcg 1 inhalation daily, atorvastatin 40 mg daily, fish oil capsule 1000 mg 2 daily, Flomax 0.4 mg daily, fluoxetine 40 mg daily, Lasix 20 mg p.o. b.i.d., gabapentin 300 mg at bedtime, Levemir solution 50 units at bedtime, lisinopril 10 mg p.o. daily, magnesium oxide 400 mg daily, metolazone 5 mg daily, metoprolol 25 mg daily, NovoLog subcutaneous 15 units t.i.d. with meals, Plavix 75 mg p.o. daily. Pro-Air HFA 1 puff  every 4-6 hours, Tylenol PM 1 tablet at bedtime, Zantac 150 mg p.o. daily.   SOCIAL HISTORY: The patient is married, lives at home with his wife, and is retired. History of smoking, but quit many years ago. No alcohol or substance abuse.   FAMILY HISTORY: Significant for CVA and hypertension.   REVIEW OF SYSTEMS: Negative for fever, fatigue, generalized weakness. He has never had nose bleeds previously. Denies tinnitus, ear pain, hearing loss, cough, wheezing, chest pain, orthopnea, palpitations, nausea, vomiting, diarrhea, abdominal pain, nocturia or rash. He has right hip pain and also has pain in his heel, where he apparently has an open sore, which is being managed by the nursing staff. He has a history of right-sided weakness due to stroke. No anxiety or depression.   PHYSICAL EXAMINATION:  VITAL SIGNS: Temperature 97.6, pulse 108, blood pressure 126/78.  GENERAL: Well-developed, well-nourished male, in bed and in no acute distress, but with blood around the right nostril, and nasal cannula oxygen in place.  HEAD AND FACE: Head is normocephalic, atraumatic. No facial skin lesions. EARS: External ears, ear canals, tympanic membranes are clear bilaterally, with no middle ear effusion or infection.  NOSE: External nose is unremarkable. There is a blood clot in the right nasal cavity anteriorly. The septum deviates to the left. I do not see any source of bleeding from the left side, but there appears to be oozing on the right anterior nasal septum.  ORAL CAVITY AND OROPHARYNX: Lips, gums, tongue, floor of mouth and posterior pharynx are clear other than some blood clot in the posterior pharynx.  NECK: Neck is supple, without adenopathy or mass. There is no thyromegaly. Salivary glands are  soft and nontender without masses.  NEUROLOGIC: Cranial nerves II through XII are grossly intact. Facial strength is normal, with no midface tenderness.   ASSESSMENT: Patient with active nosebleed from the right  side of the nose. The Afrin appeared to slow down the bleeding, but he is still oozing, so intervention with packing and possible cautery will be provided. The pack will remain in place for at least 3-4 days to ensure the bleeding is adequately controlled and the area is healing. I think this is secondary to a combination of dryness from his nasal cannula oxygen and digital trauma. I would recommend application of bacitracin ointment to the left nostril to reduce issues with dryness on that side, so that the bleeding is less likely to occur, or consider changing the patient to face bucket humidified oxygen. The nursing staff was advised that with the right nostril packed, he may actually need to be switched to face bucket oxygen, and they will check his oxygen saturations to follow him closely.    ____________________________ Ollen Gross. Willeen Cass, MD psb:MT D: 01/18/2014 09:42:43 ET T: 01/18/2014 12:29:34 ET JOB#: [id number]  cc: Ollen Gross. Willeen Cass, MD, <Dictator> Sandi Mealy MD ELECTRONICALLY SIGNED 01/21/2014 13:24

## 2014-07-26 NOTE — H&P (Signed)
PATIENT NAME:  Larry Rice, Larry Rice MR#:  [id number] DATE OF BIRTH:  08-Jul-1940  DATE OF ADMISSION:  05/30/2013  PRIMARY CARE PHYSICIAN: Dr. Jeananne Rama.  PRIMARY CARDIOLOGIST: Dr. Ubaldo Glassing.   CHIEF COMPLAINT: Weakness.   HISTORY OF PRESENT ILLNESS: This is a 75 year old male who presented with weakness and shortness of breath. Initially the patient was complaining of some weakness of his left arm and pain. He actually says that his weakness is actually bilaterally. It was not specifically only his left arm because there was a question of did the patient have a TIA, but it seems like it was just generalized weakness, definitely a little bit more on the left side, but he also had some right-sided weakness which he already has some chronic weakness on that right side. No other neurological deficits; however, over the past few days, the patient has had increasing shortness of breath, lower extremity edema, and dyspnea on exertion. BNP was elevated as well as chest x-ray showing pulmonary edema.   REVIEW OF SYSTEMS:  CONSTITUTIONAL: Positive fatigue and weakness. No weight loss or weight gain.  EYES: No blurred or double vision.  ENT: No hearing loss. Positive snoring.  RESPIRATORY: Positive cough. Positive wheezing. Positive COPD. Positive dyspnea.  CARDIOVASCULAR: No chest pain, orthopnea. Positive edema. Positive dyspnea on exertion. No palpitation or syncope. GASTROINTESTINAL: No nausea, vomiting, diarrhea.  GENITOURINARY: No dysuria or hematuria.  ENDOCRINE: No polyuria or polydipsia.  HEMOLYMPHATIC: Positive easy bruising. No anemia.  SKIN: No rash or lesions. MUSCULOSKELETAL: Residual right-sided weakness from a stroke.  NEUROLOGIC: Positive history of CVA.  PSYCHIATRIC: Some depression after his stroke.  PAST MEDICAL HISTORY:  1.  COPD, not on oxygen.  2.  History of CAD.  3.  History of atrial fibrillation.  4.  Hypertension. 5.  Diabetes. 6.  Hyperlipidemia. 7.  Peripheral vascular  disease. 8.  Congestive heart failure with EF of 30% to 40% by echocardiogram in April 2014.  bladder cancer.  9.  CVA.  10.  Chronic renal insufficiency, baseline creatinine of 1.6.   PAST SURGICAL HISTORY:  1.  Cholecystectomy.  2.  Three stents.  3.  Bypass.  4.  Carpal tunnel surgery.  5.  Attempted bypass and defibrillator.   MEDICATIONS:  The patient does not have his medication list but he says there are no changes since his discharge in April 2014.  1.  Advair Diskus 250/50 b.i.d.  2.  Allopurinol 100 mg daily.  3.  Aspirin 81 mg daily.  4.  Lantus 70 units at bedtime.  5.  Ranitidine 300 mg daily.  6.  Metoprolol 25 mg b.i.d.  7.  NovoLog 15 units t.i.d.  8.  Gabapentin 100 mg at bedtime as needed.  9.  Atorvastatin 20 mg at bedtime.  10.  Tamsulosin 0.4 mg daily.  11.  Lisinopril 10 mg daily.  12.  Norvasc 5 mg daily.  13.  Tylenol 500 mg two tablets q.6 hours p.r.n. pain.  14.  Cymbalta 60 mg daily.  15.  Spironolactone 25 mg daily.  Lasix 20 mg every other day.   SOCIAL HISTORY: The patient quit smoking over 17 years ago. No alcohol or IV drug use.   FAMILY HISTORY: Positive for CVA and hypertension and stroke.   ALLERGIES: No known drug allergies.   PHYSICAL EXAMINATION:  VITAL SIGNS: Temperature 97, pulse 79,  respirations 26, blood pressure 157/90, 94% on 2 liters.  GENERAL: Alert, oriented, and teary at times. Apparently this is from his previous stroke.  HEENT: Head is atraumatic. Pupils are round and reactive. Sclerae anicteric. Mucous membranes are moist.  OROPHARYNX: Clear.  NECK: Short. No appreciable thyromegaly or lymphadenopathy.  CARDIOVASCULAR: Regular rate and rhythm. No murmurs, gallops, or rubs. PMI is not displaced.  LUNGS: Crackles bilateral at the bases without any wheezing, rhonchi, or dullness to percussion. Normal chest expansion.  ABDOMEN: Bowel sounds are positive. Nontender. Hard to appreciate organomegaly due to body habitus. No  rebound or guarding.  EXTREMITIES: Has 1+ edema in the right lower extremity which he says is his baseline.  NEUROLOGIC: Cranial nerves II through XII are intact. There are no focal deficits. Strength: Right arm is 4/5, left arm is 5/5. Finger-to-nose is intact.   LABORATORY DATA: Sodium 135, potassium 4.7, chloride 104, bicarbonate 26, BUN 25, creatinine 1.5, and glucose is 257. BNP 7934. Total protein 6.9, albumin 2.7, bilirubin 0.7, alk phos 224, AST 30, ALT 31. Troponin 0.03. White blood cells 9.5, hemoglobin 14.3, hematocrit 43. Platelets are 263. INR is 1.0.   EKG shows demand pacemaker with underlying atrial fibrillation.   ASSESSMENT AND PLAN: A 74 year old male with a history of cardiomyopathy, EF of 35% to 40% by echocardiogram, 2014; history of cerebrovascular accident with residual right arm weakness; chronic renal insufficiency; atrial fibrillation, on aspirin therapy; who presents with shortness of breath and some generalized weakness, found to have congestive heart failure, acute-on-chronic.  1.  Acute-on-chronic congestive heart failure, ejection fraction of 35% to 40% by echocardiogram in April 2014. The patient will be admitted to telemetry. We will follow inputs and outputs, daily weights, using Lasix for diuresis. Continue his outpatient medications and re-evaluation in the a.m. The patient will need a congestive heart failure nurse prior to discharge.  2.  Weakness. This seems generalized weakness, not necessarily a neurological event. Will have physical therapy consultation.  3.  Diabetes. Continue the patient's outpatient medications, monitoring the blood sugars, provide sliding scale insulin and ADA diet.  4.  Hypertension. Continue outpatient medications including lisinopril and metoprolol as well as spironolactone. Will also continue Norvasc.  5.  Benign prostatic hypertrophy. Continue Flomax.  6.  Chronic obstructive pulmonary disease, which seemed to be stable at this time.  Continue his Advair.  7.  History of cerebrovascular accident. We will continue aspirin and statin.  8.  Depression. We will continue duloxetine.   CODE STATUS: FULL CODE.   TIME SPENT: Approximately 45 minutes.   ____________________________ Donell Beers. Benjie Karvonen, MD spm:np D: 05/30/2013 19:19:40 ET T: 05/30/2013 20:05:52 ET JOB#: [id number]  cc: Lorayne Getchell P. Benjie Karvonen, MD, <Dictator> Javier Docker. Ubaldo Glassing, MD Guadalupe Maple, MD Donell Beers Flem Enderle MD ELECTRONICALLY SIGNED 05/31/2013 14:02

## 2014-07-26 NOTE — Discharge Summary (Signed)
PATIENT NAME:  Larry Rice, Larry Rice MR#:  [id number] DATE OF BIRTH:  Nov 07, 1940  DATE OF ADMISSION:  05/30/2013 DATE OF DISCHARGE:  05/31/2013   ADMISSION DIAGNOSES:  1.  Acute congestive heart failure exacerbation on chronic systolic heart failure.  2.  Chronic obstructive pulmonary disease.   DISCHARGE DIAGNOSES:  1.  Acute-on-chronic systolic heart failure with ejection fraction 30% to 40% prior echocardiogram.  2.  Weakness from congestive heart failure.  3.  Lower-extremity edema.  4.  History of coronary artery disease.  5.  Diabetes.  6.  History of chronic obstructive pulmonary disease.   CONSULTATIONS: None.   IMAGING: The patient had lower extremity Dopplers negative for DVTs.   DISCHARGE LABORATORIES: Sodium 139, potassium 3.9, chloride 108, bicarb 26, BUN 25, creatinine 1.65. Glucose is 159. Troponins x3 were negative.   HOSPITAL COURSE: This is a very pleasant 74 year old male with history of stroke with residual right-sided weakness who initially presented with weakness and his weakness was actually generalized but actually was found to have elevated BNP. Chest x-ray consistent with acute-on-chronic congestive heart failure. For further details, please refer to the H and P.   1.  Acute-on-chronic systolic heart failure. The patient is improved dramatically with IV Lasix. He has diuresed -1600. His creatinine is trending slightly up so at this time he is euvolemic. He will need some oxygen at discharge basically for his COPD. He has had oxygen in the past. On exertion, he is 88% on room air. He feels much better and we will have an outpatient follow-up for his congestive heart failure and home health nurse.  2.  Lower-extremity edema which has been stable chronically. We did obtain some Dopplers which were negative for CHF and not a neurological event. He is back to his baseline with residual right-sided weakness from previous stroke.  3.  History of CAD. The patient was continued  on outpatient medication. Troponins are negative.  4.  Diabetes. The patient continues outpatient medications.   DISCHARGE MEDICATIONS:  1.  Advair 250/50 b.i.d.  2.  Allopurinol 100 mg daily.  3.  Aspirin 81 mg daily.  4.  Lantus 70 units at bedtime.  5.  Ranitidine 300 mg at bedtime.  6.  Metoprolol 25 mg b.i.d.  7.  NovoLog 15 units t.i.d.  8.  Gabapentin 100 mg at bedtime p.r.n.  9.  Atorvastatin 20 mg at bedtime.  10.  Tamsulosin 0.4 mg daily.  11.  Lisinopril 10 mg daily.  12.  Cymbalta 60 mg daily.  13.  Lasix 20 mg daily.  14.  Norvasc 10 mg daily.   We have held spironolactone due to the kidney issues.   I think he has chronic renal insufficiency but as his creatinine remained pretty much stable, he needs to follow up for his BNP.   DISPOSITION: Discharge home with home health, CHF nurse.   DISCHARGE OXYGEN: Two liters.   DISCHARGE DIET: ADA diet.   DISCHARGE ACTIVITY: As tolerated.   FOLLOWUP: The patient will follow up with Vonita MossMark Crissman, MD, in one week.   The patient would need a repeat BNP in one week's time.   The patient is medically stable for discharge.   TIME SPENT: Approximately 35 minutes.   ____________________________ Janyth ContesSital P. Juliene PinaMody, MD spm:np D: 05/31/2013 13:39:00 ET T: 05/31/2013 15:14:30 ET JOB#: [id number]  cc: Pasqualina Colasurdo P. Juliene PinaMody, MD, <Dictator> Steele SizerMark A. Crissman, MD Janyth ContesSITAL P Yulitza Shorts MD ELECTRONICALLY SIGNED 06/02/2013 21:16
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
## 2014-07-26 NOTE — H&P (Signed)
PATIENT NAME:  Larry Rice, Larry Rice MR#:  [id number] DATE OF BIRTH:  02-01-1941  DATE OF ADMISSION:  01/17/2014  ADMITTING PHYSICIAN: Crissie FiguresEdavally N. Erion Weightman, MD  PRIMARY CARE DOCTOR: Steele SizerMark A. Crissman, MD. The patient was transferred from [facility], [place], [place], and is a direct admission to orthopedic floor.  CHIEF COMPLAINT: Status post fall with a right hip fracture.   HISTORY OF PRESENT ILLNESS: Mr. Larry Rice is a 74 year old Caucasian male with a past medical history significant for hypertension, diabetes mellitus type 2, coronary artery disease status post CABG, diabetes mellitus on insulin, CKD stage 3, COPD, history of CVA with right hemiparesis, status post left carotid endarterectomy in July 2015, hyperlipidemia, history of congestive heart failure, who was transferred from [facility], [place], [place], to [facility] in view of a right hip fracture sustained secondary due to tangled in bed (mechanical fall) for further management. The patient states he was in a [place] at [place], and in the early hours of 01/17/2014, around 5:00 a.m. while he was trying to get out of the bed, he got tangled and fell off the bed and sustained injury to the right hip. The patient was taken to the Emergency Room at [facility], [place], [place], where he was evaluated by the ED physician and was diagnosed to have a right hip fracture of the neck. The patient is primarily from [place], and the decision was taken by the ED physician at [facility], [place], [place] , to transfer the patient to [facility] for further continuation of care because of nonavailability of services or of medical specialty services over the weekend. The patient was earlier accepted by the on-call physician at [facility] and reached [facility] around 11:00  p.m. on 01/17/2014. The patient currently is resting in the bed, states he is doing okay and the right hip pain is under control with the pain medications. The patient denied any history of any chest pain, shortness of breath, dizziness, loss of consciousness, palpitations prior to the fall. He also denies any such symptoms at present. No history of any fever, cough. The patient had complete workup in the Emergency Room which showed stable vital signs, and x-ray of the right hip showed fracture of the neck of the right femur.  PAST MEDICAL HISTORY: 1.  History of hypertension.  2.  History of coronary artery disease status post CABG.  3.  History of diabetes mellitus on insulin.  4.  History of CKD stage 3.  5.  History of COPD.  6.  History of CVA with right hemiparesis.  7.  Status post left carotid endarterectomy in July 2015.  8.  Hyperlipidemia.  9.  History of congestive heart failure. 10.  Peripheral arterial disease status post surgery.  11.  History of bladder cancer status post surgery about 7 years ago.   PAST SURGICAL HISTORY:  1.  Cholecystectomy.  2.  Coronary artery bypass surgery.  3.  Surgery for bladder cancer.  4.  Left carotid endarterectomy in July 2015.  ALLERGIES: No known drug allergies.   HOME MEDICATIONS: 1.  Anoro Ellipta inhalation 62.5/25 mcg, 1 inhalation daily. 2.  Atorvastatin 40 mg 1 daily.  3.  Fish oil capsule 1000 mg 2 capsules daily.  4.  Flomax 0.4 mg 1 daily.  5.  Fluoxetine 40 mg 1 capsule daily.  6.  Furosemide 20 mg tablet 1 tablet twice  a day.  7.  Gabapentin 300 mg 1 capsule at bedtime.  8.  Levemir solution 50 units at bedtime.  9.  Lisinopril 10 mg 1 tablet daily.  10.  Magnesium oxide 400 mg 1 tablet daily.  11.  Metolazone 5 mg tablet once daily.  12.  Metoprolol 25 mg 1 tablet daily.  13.  NovoLog subcutaneous 15 units 3 times a day with meals.  14.  Plavix 75 mg 1 tablet daily.  15.  ProAir HFA inhalation 1 puff every 4 to 6  hours.  16.  Tylenol PM Extra Strength 500/25 mg 1 tablet at bedtime.  17.  Zantac 150 mg 1 tablet daily.   SOCIAL HISTORY: The patient married, lives at home with his wife. Retired. History of smoking in the remote past, quit many years ago. Denies any history of alcohol or substance usage.  FAMILY HISTORY: Significant for CVA and hypertension.  REVIEW OF SYSTEMS: CONSTITUTIONAL: Negative for any fever, fatigue, or generalized weakness.  EYES: Negative for blurred or double vision. No redness, pain, or inflammation. ENT: Negative for tinnitus, ear pain, hearing loss, epistaxis, nasal discharge. RESPIRATORY: Denies any cough, wheezing, hemoptysis, dyspnea, or painful respiration. History of COPD, stable on inhalers. CARDIOVASCULAR: Denies any chest pain, orthopnea, edema, palpitations, loss of consciousness or dizziness.  GASTROINTESTINAL: Negative for nausea, vomiting, diarrhea, abdominal pain, hematemesis, melena. GENITOURINARY: Denies any dysuria, hematuria, frequency, or urgency. ENDOCRINE: Denies any polyuria, polydipsia, nocturia. No heat or cold intolerance. HEMATOLOGIC: Denies any easy bruising or bleeding. SKIN: Denies any acne, skin rash, or lesions. MUSCULOSKELETAL: Right hip pain following fall with a fracture of right hip. Otherwise denies any joint pains. No back pain. NEUROLOGICAL: Denies any new weakness. He does have a right-sided weakness secondary due to a stroke with residual paralysis on the right side. PSYCHIATRIC: Denies any anxiety, insomnia, or depression.   PHYSICAL EXAMINATION: VITAL SIGNS: Temperature 98.2 degrees Fahrenheit; pulse rate 79 per minute, irregular; respirations 22; blood pressure 119/75; pulse oximetry 90% on room air.  GENERAL: Well built, well nourished, obese, alert, in no acute distress, pleasant and cooperative. HEAD: Normocephalic, atraumatic. EYES: Pupils equal and reactive to light. No conjunctival pallor. No scleral icterus. Extraocular  movements intact. NOSE: No nasal lesions. No drainage. EARS: No drainage, no external lesions. MOUTH: No lesions, no exudates. NECK: Supple. No JVD. No thyromegaly. No carotid bruit. Full range of motions without any pain.  RESPIRATORY: Bilateral vesicular breath sounds present. Occasional rhonchi present. Not using any accessory muscles of respiration. CARDIOVASCULAR: Rate irregular. No murmurs, gallops. Pulses equal at carotid, femoral and pedal pulses. No peripheral edema. GASTROINTESTINAL: Abdomen soft, obese and nontender. No hepatosplenomegaly. Bowel sounds present and equal in all 4 quadrants. No rebound. No guarding. No rigidity. GENITOURINARY: Deferred. MUSCULOSKELETAL: Right hip external rotation in soft support. Not able to move because of the fracture. Right upper and lower extremity residual paresis from previous CVA. Left hip and left upper extremity movements normal. SKIN: Inspection within normal limits. LYMPHATIC: Negative for cervical lymphadenopathy. VASCULAR: Good dorsalis pedis and posterior tibial pulses. NEUROLOGICAL: Cranial nerves II through XII intact. Right hemiparesis present. Left upper and lower extremity power 5/5 and sensory normal. PSYCHIATRIC: Judgment and insight adequate. Alert and oriented x 3. Memory and mood within normal limits.  LABORATORY DATA: Performed at [facility], [place], [place], Emergency Room: Urinalysis: Trace bacteria, nitrite negative, LE trace. CBC: White blood cell count 10.6, hemoglobin 10.0, hematocrit 32, platelet count 180,000. Comprehensive metabolic panel: Sodium 139, potassium 4.1,  chloride 97, bicarbonate 30, calcium 9.4, albumin 3.1, protein 6.6, BUN 62, creatinine 2.2, glucose 129, alkaline phosphatase 70, AST 12, ALT 21, total bilirubin 0.7. Troponin 0.03. BNP 1433. Prothrombin time 12.5, INR 1.1, PTT 29.  IMAGING STUDIES: 1.  X-ray of the pelvis findings: Transverse fracture of the surgical neck, right femur,  with overlying fracture fragments and increased angulation. The right femur head remains well seated in the right acetabular fossa. 2.  Left proximal femur and left hip joint is intact. Pubic symphysis and bilateral sacroiliac joints are within normal limits. Soft tissue within normal limits. 3.  Chest x-ray: Mildly enlarged cardiac silhouette with prior mediastinal sternotomy and AICD present. No focal pulmonary consolidation. 4.  EKG: Atrial fibrillation with a ventricular rate of 83 beats per minute, frequent ventricular paced complexes with premature ventricular contractions, left axis deviation present.   ASSESSMENT AND PLAN: A 74 year old Caucasian male with a past medical history of multiple medical problems who was transferred from [facility], [place], Emergency Room to [facility] with fracture, right femoral neck, secondary due to mechanical fall. 1.  Status post fall secondary due to tangled in bed (mechanical) with fracture, right hip. The patient stable clinically. Plan: Bed rest, pain control medications, hold Plavix because of upcoming planned surgery, and orthopedic consultation. Will place Foley. 2.  History of chronic atrial fibrillation with controlled ventricular rate. The patient stable clinically. Continue home medications. 3.  History of hypertension, controlled on home medications. Continue home medications. 4.  History of coronary artery disease status post coronary artery bypass graft, stable clinically. Hold Plavix because of contemplating surgery for the right hip fracture.  5.  History of diabetes mellitus on insulin, stable. Will decrease Levemir to 35 units at bedtime and continue sliding scale insulin.  6.  History of chronic kidney disease stage 3, creatinine 2.2, stable. Continue monitoring.  7.  History of chronic obstructive pulmonary disease, stable on nebulizers and inhalers. Continue same.  8.  History of  cerebrovascular accident with right hemiparesis, stable clinically.  9.  Status post left carotid endarterectomy in July 2015, stable.  10.  Hyperlipidemia, stable. Continue home medications.  11.  History of congestive heart failure, compensated. Per old records, ejection fraction was 30% to 40% as per the old records of June 2015.  12.  Deep vein thrombosis prophylaxis. Sequential compression devices. Will not start on heparin because of contemplating surgery for the right hip fracture.  CODE STATUS: Full code.  TIME SPENT: 55 minutes.   ____________________________ Crissie Figures, MD enr:ST D: 01/18/2014 00:31:10 ET T: 01/18/2014 01:20:37 ET JOB#: [id number]  cc: Crissie Figures, MD, <Dictator> Steele Sizer, MD Crissie Figures MD ELECTRONICALLY SIGNED 01/26/2014 4:41

## 2014-07-26 NOTE — Consult Note (Signed)
PATIENT NAME:  Larry Rice, Larry Rice MR#:  [id number] DATE OF BIRTH:  1940-05-27  DATE OF CONSULTATION:  03/19/2014  REFERRING PHYSICIAN:   CONSULTING PHYSICIAN:  Audery Amel, MD  IDENTIFYING INFORMATION AND REASON FOR CONSULTATION: A 74 year old man with a past history of stroke currently in the hospital for shortness of breath and some mental status changes. Consult for anxiety.   HISTORY OF PRESENT ILLNESS: Information obtained from the patient, the chart, and the patient's family. Consult note states that family are concerned that his anxiety is worse than usual. On interview today the patient initially denied being aware of any new or remarkable mood problems. He does describe a symptom of sudden outbursts of crying which are very exaggerated and stereotyped and can occur with even minor stimulus. This has been happening ever since he had his stroke a couple of years ago. Outside of these however he initially tells me that his mood is feeling okay. Says that he sleeps okay and enjoys his life at home with his wife and family. He denied any suicidal ideation. After we talked for a while his story changed a little bit. He told me that the only reason he wanted to leave the hospital at all was for the sake of his children. He told me that he has been feeling more hopeless, like there is no chance that anything will get any better for him. He has had morbid ruminations and thoughts about death. He now thinks that everything that is wrong with him is going to kill him and he cannot stop thinking about it. He says that he does not have any wish to die, no suicidal ideation at all. Does not report any auditory hallucinations. Does not report any visual hallucinations. He is currently being treated with duloxetine 60 mg a day as an antidepressant. I used to the old chart we have here and some history provided by the patient to try and put together the course of his antidepressant treatment. He seems to have  been taking this medicine for probably about a year or so. He tells me that when he was in rehabilitation recently for a broken hip they had started him on another antidepressant which he thought was Prozac, but that that made him agitated so they discontinued it. He a couple of years ago had been on Effexor at least for some period of time. He thinks that antidepressants had been helpful in the past, but have lost their effectiveness.   PAST PSYCHIATRIC HISTORY: Prior to having the stroke he claims to have never had any problems with depression or anxiety. Never had any psychiatric treatment or medication. No psychiatric hospitalizations. No history of suicide attempts. As mentioned above it looks like he has been on Effexor at least for some period of time, perhaps Prozac at some point, and now Cymbalta for probably at least a year. All of these prescribed by primary care and not by a psychiatrist.   SOCIAL HISTORY: Lives with his wife. 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 He has adult children who live in the area and says he is on good terms with all of them. Reports that he enjoys his life and time with his family.   PAST MEDICAL HISTORY: The patient suffered a stroke a few years ago which has caused some chronic residual symptoms. He also has dyslipidemia, has diabetes requiring insulin, high blood pressure, COPD. He is currently being treated  with Levaquin and Solu-Medrol IV for his shortness of breath and worsening COPD.  He also is currently as an outpatient being treated with hydrocodone 10 mg 4 times a day p.r.n. pain which started after he had a recent broken hip. I asked him and his family whether the narcotic pain medicine seems to have affected his mood and they all readily agreed that the narcotics seem to have made him more labile and depressed.   FAMILY HISTORY: He thinks there may have been a family history of mental illness, but does not know  any details about it.   SUBSTANCE ABUSE HISTORY: Denies any alcohol abuse, substance abuse, does not seem to be an active problem at all.   REVIEW OF SYSTEMS: Clearly bothered by his frequent outbursts of crying which I got to witness several times. Recently been feeling more hopeless and down. Morbid ruminations. No hallucinations. Physically he is having pain in his hip, also still having shortness of breath.   MENTAL STATUS EXAMINATION: Elderly man, appears acutely ill, but cooperative and able to have a reasonable conversation. Good eye contact. Psychomotor activity a little bit slow and limited. Speech is decreased in amount and slow and somewhat halting. Affect is most remarkable for the several episodes of suddenly bursting into sobbing tears over very minor stimuli, a situation which would then clear up in a matter of seconds. Otherwise was euthymic with appropriate smiling and interaction. Mood stated as being a little hopeless. Thoughts are lucid. No evidence of delusions or loosening of associations. Denies hallucinations. Denies suicidal or homicidal ideation. He could repeat 3 out of 3 objects immediately and 2 out of 3 at 3 minutes. He was alert and oriented x 4. Judgment and insight appear adequate.   LABORATORY RESULTS: Blood sugars are running mostly in the low 200s or so. His pO2 on his last blood gas was still a little low. Urinalysis normal. His head CT showed his old stroke clearly there, also extensive small vessel disease. Has a right PCA infarct.   VITAL SIGNS: Blood pressure 148/83, respirations 18, pulse 80, temperature 97.   ASSESSMENT: This is a 74 year old man who probably has multiple factors influencing his mood. 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 On top of that however it sounds like he is having some symptoms of depression, although not a full major depressive syndrome. Just  more worried and nervous and mood labile. Some factors that could be involved with this would be his narcotics as well as the steroids right now, although underlying anxiety and depression are probably there as well. He is not suicidal, does not require psychiatric hospitalization.   TREATMENT PLAN: I do not think at this point I would recommend starting dextromethorphan for the pseudobulbar affect, but rather trying to treat the more acute mood symptoms. He is already on Cymbalta. I suggest adding Remeron 30 mg at night. I considered suggesting Abilify, but do not want to disturb his blood sugars or cause any other side effects that not necessary. The patient needs to follow up with this once he gets out of the hospital and make sure his mood being paid attention to and does not turn into a worse depression. Benzodiazepines should probably be limited or nonexistent and pain medicine limited to what is necessary.   DIAGNOSIS PRINCIPAL AND PRIMARY:  AXIS I: Depression not otherwise specified.   SECONDARY DIAGNOSES:  AXIS I: Mood  lability secondary directly to the stroke.   AXIS II: Deferred.   AXIS III:  1.  History of stroke.  2.  Chronic obstructive pulmonary disease.  3.  High blood pressure.    ____________________________ Audery Amel, MD jtc:bu D: 03/19/2014 15:51:20 ET T: 03/19/2014 16:15:29 ET JOB#: [id number]  cc: Audery Amel, MD, <Dictator> Audery Amel MD ELECTRONICALLY SIGNED 03/26/2014 0:40
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
## 2014-07-26 NOTE — H&P (Signed)
PATIENT NAME:  Larry, Rice MR#:  [id number] DATE OF BIRTH:  01-Mar-1941  DATE OF ADMISSION:  03/18/2014  PRIMARY CARE PHYSICIAN:  Larry Sizer, MD   REFERRING PHYSICIAN:  Enedina Finner. Manson Passey, MD  CHIEF COMPLAINT:  Altered mental status.   HISTORY OF PRESENT ILLNESS:  Larry Rice is a 74 year old male with a history of multiple medical problems including hypertension, hyperlipidemia, diabetes mellitus, previous history of stroke, coronary artery disease status post coronary artery bypass grafting, chronic kidney disease, and congestive heart failure, who is brought to the Emergency Department with complaints of altered mental status that started since yesterday morning. Yesterday morning, the patient had some confusion, which was resolved by afternoon, however, again started to have confusion since yesterday evening and irritable. He has been having cough more than usual. He did not have any shortness of breath. Concerning this, the patient is brought to the Emergency Department. The patient is quite restless in the bed. Could not obtain any history from the patient. The history is mainly obtained from the patient's wife, who is at the bedside. Workup was done in the Emergency Department in regards to another stroke. Obtained CT of the head without contrast did not show any acute abnormality, extensive chronic small vessel disease and remote right PCA territory stroke. No other obvious signs of infection are noted.   PAST MEDICAL HISTORY: 1.  Hypertension.  2.  Hyperlipidemia.  3.  Coronary artery disease status post coronary artery bypass grafting.  4.  Diabetes mellitus, insulin-dependent.  5.  Chronic kidney disease, baseline creatinine of 1.79.  6.  COPD.  7.  History of CVA with right hemiparesis residual.  8.  Status post left carotid endarterectomy.  9.  Congestive heart failure, diastolic.  10.  Peripheral vascular disease.  11.  History of bladder cancer status post surgery 7  years back.  12.  Chronic pain.   PAST SURGICAL HISTORY: 1.  Cholecystectomy.  2.  Coronary artery bypass surgery.  3.  Surgery for bladder cancer.  4.  Left carotid endarterectomy.   ALLERGIES:  No known drug allergies.   HOME MEDICATIONS: 1.  Tylenol 2 tablets every 6 hours.  2.  Tamsulosin 0.4 mg daily.  3.  Silver sulfadiazine to the affected area.  4.  ProAir 2 puffs 4 times a day.  5.  NovoLog 50 units 3 times a day.  6.  Norco 10/325 mg every 4 hours as needed.  7.  Mirtazapine 15 mg at bedtime.  8.  Metoprolol 25 mg 2 times a day.  9.  Metolazone 5 mg once a day.  10.  Lisinopril 10 mg daily.  11.  Levemir 50 units at bedtime.  12.  Gabapentin 300 mg at bedtime.  13.  Lasix 20 mg once a day.  14.  Fish oil 1000 mg 3 times a day.  15.  Ferrous sulfate 325 mg 3 times a day.  16.  Duloxetine 60 mg once a day.  17.  Docusate sodium 100 mg once a day.  18.  Calcium with vitamin D.  19.  Baclofen 10 mg 3 times a day.  20.  Atorvastatin 40 mg once a day.  21.  Anoro Ellipta 62.5 mcg 1 puff once a day.   SOCIAL HISTORY:  Former smoker, quit 18 years back. Denies drinking alcohol or using illicit drugs. Married and lives with his wife. Independent of ADLs around the house.   FAMILY HISTORY:  Significant for CVA and hypertension.  REVIEW OF SYSTEMS:  Could not be obtained from the patient secondary to confusion.   PHYSICAL EXAMINATION: GENERAL:  This is a well-built, well-nourished, obese male lying down in the bed, confused and agitated.  VITAL SIGNS:  Temperature is 98.1, pulse 75, blood pressure 122/110, respiratory rate 22, and oxygen saturation is 94% on 2 liters of oxygen.  HEENT:  Head is normocephalic and atraumatic. There is no scleral icterus. Conjunctivae are normal. Pupils are equal and react to light. I cannot examine the mucous membranes.  NECK:  Supple. No lymphadenopathy. No JVD. No carotid bruit. No thyromegaly. CHEST:  Has no focal tenderness, bilateral  diffuse wheezing.  HEART:  S1, S2 regular. No murmurs are heard.  ABDOMEN:  Bowel sounds present. Soft, nontender, nondistended. No hepatosplenomegaly.  EXTREMITIES:  Pulses are feeble.  SKIN:  Mild erythema around the wounds. Ulcers on bilateral heels present at admission.  NEUROLOGIC:  The patient is oriented to self but not to place and time. No cranial nerve abnormalities. Cannot examine motor and sensory, as the patient is not cooperative.  MUSCULOSKELETAL:  Cannot examine.   LABORATORY DATA:  BMP:  BUN 52, creatinine 1.89.   CBC:  WBC 8.9, hemoglobin 10.2, platelet count 192,000.   ASSESSMENT AND PLAN: 1.  Altered mental status, highly concerning about the medications in addition to chronic obstructive pulmonary disease exacerbation. We will obtain ABG and hold all sedative medications. If the patient is still found to have any neurologic deficits, then consider getting the MRI. We will hold the MRI for now.  2.  Chronic obstructive pulmonary disease exacerbation. Continue with DuoNebs, Solu-Medrol, and levofloxacin.  3.  Chronic pain syndrome. We will hold all sedative medications.  4.  Hypertension, moderately controlled. We will continue the home medications.  5.  Severe peripheral vascular disease. The patient is supposed to have angiogram of the lower extremities and will need to follow up as an outpatient. Continue with the Plavix.  6.  Diabetes mellitus. Decrease the dose of the Levemir to 40 units. Continue the sliding scale insulin.  7.  History of chronic atrial fibrillation. Rate is controlled, not on any anticoagulation.  8.  Chronic kidney disease. Creatinine seems to be at baseline of 1.89.   TIME SPENT:  55 minutes.    ____________________________ Larry GriffinsPadmaja Janelli Welling, MD pv:nb D: 03/18/2014 04:53:19 ET T: 03/18/2014 05:26:14 ET JOB#: [id number]  cc: Larry GriffinsPadmaja Danise Dehne, MD, <Dictator> Larry SizerMark A. Crissman, MD Larry GriffinsPADMAJA Sandi Towe MD ELECTRONICALLY SIGNED 03/31/2014 22:10

## 2014-07-26 NOTE — Op Note (Signed)
PATIENT NAME:  Larry Rice, Larry Rice MR#:  [id number] DATE OF BIRTH:  29-Jul-1940  DATE OF PROCEDURE:  01/19/2014  PREOPERATIVE DIAGNOSIS: Displaced femoral neck fracture, right hip.   POSTOPERATIVE DIAGNOSIS:  Displaced femoral neck fracture, right hip.   PROCEDURE PERFORMED: Right hip hemiarthroplasty (Stryker Accolade  #4 femoral stem, 51 mm Unitrax head, +4 mm neck length).   SURGEON: Valinda HoarHoward E. Jameria Bradway, M.D.   ASSISTANAlberteen Spindle: Cline, M.D.   ANESTHESIA: General endotracheal.   COMPLICATIONS: None.   DRAINS: Two Hemovacs.   ESTIMATED BLOOD LOSS: 200 mL.  REPLACEMENT:   None.   DESCRIPTION OF PROCEDURE: The patient was brought to the operating room where he underwent satisfactory general endotracheal anesthesia in the supine position. He was turned to the left lateral decubitus position on a beanbag and padded appropriately. The left leg hip was prepped and draped in a sterile fashion and a posterolateral incision made. Dissection was carried out sharply through the subcutaneous tissue down to fascia. The fascia was incised and Charnley retractor inserted. The short external rotators were divided and tagged. The posterior capsule was divided and tagged. The fracture site was visible. The leg was rotated and a femoral head was removed. It measured 51 to 52 mm in size. The femoral neck was cut with an oscillating saw at the appropriate angle. The acetabulum was cleared of debris and soft tissue was removed to prevent push out. The femoral canal was then broached. It was then opened and broached  sequentially up to a #4 Stryker Accolade prosthesis. The trial reductions were carried out with a standard and +4 and +8 mm neck lengths and a 51 mm head. The +4 mm neck length appeared to provide the best leg length and was stable. The trials were removed and after thorough irrigation, the #4 Accolade stem was inserted in as much anteversion as possible and seated snugly.  We retrialed the neck lengths and  decided to stay with the + 4 mm.  The 51 mm Unitrax head with A+ four neck length was attached and the hip was reduced and was stable. Leg lengths appeared to be excellent. After irrigation, the capsule was closed with #1 Ticron suture. The short external rotators were repaired with the similar suture. The fascia was closed with #2  Quill over a Hemovac drain, and the subcutaneous tissue was closed with 0 Quill over another Hemovac after thorough irrigation at each level. The skin was closed with staples. The Hemovac was activated and a dry sterile dressing applied. The patient was carefully rolled onto his back on the operating table and extubated. He tolerated the procedure well and the breathing remained much better than expected. He was transferred to a hospital bed and taken to recovery in good condition. Leg lengths appeared to be excellent.   ____________________________ Valinda HoarHoward E. Fallou Hulbert, MD hem:jw D: 01/19/2014 13:01:07 ET T: 01/19/2014 13:54:24 ET JOB#: [id number]  cc: Valinda HoarHoward E. Chamara Dyck, MD, <Dictator> Valinda HoarHOWARD E Jacquese Cassarino MD ELECTRONICALLY SIGNED 01/20/2014 13:19

## 2014-07-26 NOTE — Op Note (Signed)
PATIENT NAME:  Larry KettleCOPELAND, [name] MR#:  [id number] DATE OF BIRTH:  04-05-1940  DATE OF PROCEDURE:  01/18/2014  PREOPERATIVE DIAGNOSIS: Right-sided epistaxis.   POSTOPERATIVE DIAGNOSIS: Right-sided epistaxis.   PROCEDURE: Complex control of right nasal epistaxis utilizing extensive cautery and anterior nasal packing.   SURGEON: Ollen Grossaul S. Willeen CassBennett, MD.  FINDINGS: There was persistent bleeding from the right anterior nasal septum from a small vessel there. The patient is on Plavix and required extensive cauterization with silver nitrate as well as anterior packing for control of the bleeding.   DESCRIPTION OF PROCEDURE: After discussing the procedure with the patient the right nasal cavity was inspected and blood clot suctioned. Bleeding was noted from the anterior nasal cavity. Initially an attempt was made just to pack the area with a small Merocel pack. The pack was placed and Afrin applied. Unfortunately, the patient began hemorrhaging around the pack. Silver nitrate was obtained from the pharmacy and the old pack removed. The bleeding area was suctioned clear of clot and repeatedly cauterized with silver nitrate which appeared to slow the bleeding. A Xerogel pack was then placed over the region of bleeding followed by short Merocel pack and Afrin applied to the area again. This appeared to control the bleeding adequately. The patient tolerated the procedure well. He should be kept on antibiotics postprocedure until the pack is removed to reduce risk of toxic shock.    ____________________________ Ollen GrossPaul S. Willeen CassBennett, MD psb:lt D: 01/18/2014 09:46:03 ET T: 01/18/2014 10:07:47 ET JOB#: [id number]  cc: Ollen GrossPaul S. Willeen CassBennett, MD, <Dictator> Sandi MealyPAUL S Leeloo Silverthorne MD ELECTRONICALLY SIGNED 01/21/2014 13:19

## 2014-07-26 NOTE — Consult Note (Signed)
PATIENT NAME:  Larry Rice, Larry Rice MR#:  [id number] DATE OF BIRTH:  Jan 04, 1941  DATE OF CONSULTATION:  01/18/2014  REFERRING PHYSICIAN:  Srikar R. Sudini, MD.  CONSULTING PHYSICIAN:  Marcina Millard, MD  PRIMARY CARE PHYSICIAN:  Steele Sizer, MD.   CARDIOLOGIST:  Darlin Priestly. Lady Gary, MD.  CHIEF COMPLAINT: "I fractured my hip."   HISTORY OF PRESENT ILLNESS: The patient is a 74 year old gentleman with known history of coronary artery disease, cerebrovascular disease, and chronic kidney disease, who is referred for preoperative cardiovascular evaluation prior to surgery for right hip fracture. The patient was transferred on 01/17/2014 from [facility] for right hip fracture when the patient fell out of bed. The patient has known coronary artery disease, status post coronary artery bypass graft surgery. He has known ischemic cardiomyopathy status post ICD with chronic systolic congestive heart failure with baseline shortness of breath. The patient also has known cerebrovascular disease with recent history of CVA with right hemiparesis and left carotid endarterectomy in July 2015. In addition, the patient has chronic kidney disease with elevated BUN and creatinine. The patient denies chest pain but does have exertional dyspnea as well as episodes of shortness of breath at rest.   Admission labs were notable for elevated BUN and creatinine of 63 and 2.3, respectively.   EKG was nondiagnostic.   Chest x-ray reveals mild pulmonary edema.   PAST MEDICAL HISTORY:  1.  Status post coronary artery bypass graft surgery.  2.  Ischemic cardiomyopathy status post ICD. 3.  Chronic systolic congestive heart failure.  4.  Hypertension.  5.  Hyperlipidemia.  6.  Cerebrovascular disease status post CVA and left carotid endarterectomy July 2015.  7.  COPD. 8.  Chronic kidney disease, stage III.   MEDICATIONS: Plavix 75 mg daily, atorvastatin 40 mg daily, furosemide 20 mg b.i.d., lisinopril 10  mg daily, metolazone 5 mg daily, metoprolol 25 mg daily, Anoro Ellipta inhalation 62.5/25 mcg 1 inhalation daily, fish oil capsules 1000 mg 2 capsules daily, Flomax 0.4 mg daily, fluoxetine 40 mg daily, gabapentin 300 mg at bedtime, Levemir 50 units at bedtime, magnesium oxide 400 mg daily, NovoLog 15 units t.i.d. with meals, ProAir 1 puff q. 4-6 hours, Tylenol PM Extra Strength 1 at bedtime, Zantac 150 mg daily.   SOCIAL HISTORY: The patient is married, lives with his wife. He has remote tobacco abuse history.   FAMILY HISTORY: Noncontributory for myocardial infarction.  REVIEW OF SYSTEMS:   CONSTITUTIONAL: No fever or chills.  EYES: No blurry vision.  EARS: No hearing loss.  RESPIRATORY: Chronic shortness of breath with exertion and occasionally at rest.  CARDIOVASCULAR: No chest pain.  GASTROINTESTINAL: No nausea, vomiting, or diarrhea.  GENITOURINARY: No dysuria or hematuria.  ENDOCRINE: No polyuria or polydipsia.  MUSCULOSKELETAL: The patient has right hip fracture.  NEUROLOGIC: No focal muscle weakness or numbness. The patient did have recent stroke, status post right carotid endarterectomy. PSYCHOLOGICAL: No depression or anxiety.   PHYSICAL EXAMINATION:  VITAL SIGNS: Blood pressure 143/84, pulse 75, respirations 16, temperature 96.8, pulse oximetry 99%.  HEENT: Pupils equal, reactive to light and accommodation.  NECK: Supple without thyromegaly. LUNGS: Decreased breath sounds in both bases.  CARDIOVASCULAR: Normal JVP. Diffuse PMI. Regular rate and rhythm. Normal S1, S2. No appreciable gallop, murmur, or rub.  ABDOMEN: Soft and nontender.  EXTREMITIES: There is trace to 1+ bilateral pedal edema.  MUSCULOSKELETAL: Normal muscle tone.  NEUROLOGIC: The patient is alert and oriented x 3. Motor and sensory are both grossly intact.  IMPRESSION: A 74 year old gentleman with known coronary artery disease, status post CABG, with ischemic cardiomyopathy, status post implantable cardiac  defibrillator with chronic systolic congestive heart failure, who presents with right hip fracture, awaiting surgery. The patient has known history of congestive heart failure, chronic kidney disease and prior stroke and certainly is at at least moderate risk for serious cardiovascular complication. However, the patient requires hip surgery to resume previous lifestyle. The patient understands risks and is willing to proceed with surgery as planned.   RECOMMENDATIONS:  1.  Agree with overall current therapy.  2.  Would defer full dose anticoagulation.  3.  Continue diuresis.  4.  Continue metoprolol pre-operatively, peri-operatively, and postoperatively.  5.  Obtain EKG postoperatively.  6.  Will continue to follow with you.    ____________________________ Marcina MillardAlexander Cashel Bellina, MD ap:lt D: 01/18/2014 13:50:32 ET T: 01/18/2014 15:24:12 ET JOB#: [id number]  cc: Marcina MillardAlexander Norberta Stobaugh, MD, <Dictator> Marcina MillardALEXANDER Najae Filsaime MD ELECTRONICALLY SIGNED 01/19/2014 10:45

## 2014-07-26 NOTE — Op Note (Signed)
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   PROCEDURE PERFORMED: 1.  Left carotid endarterectomy of the internal and common carotid arteries with xenograft patch repair.  2.  Repair of arterial defect with CorMatrix xenograft patch.   SURGEON: Renford Dills, M.D.   ANESTHESIA: General by endotracheal intubation.   FLUIDS: Per anesthesia record.   ESTIMATED BLOOD LOSS: 175 mL.   SPECIMEN: Plaque to pathology for permanent section.   INDICATIONS: Larry Rice is a 74 year old gentleman who sustained a left hemispheric infarct months ago. At that time, he was identified as having a critical stenosis of the internal carotid artery located at the proximal portion of the internal carotid artery with a narrowing of 90%.  On CT angiography, he also has a 65% to 70% narrowing of the common carotid approximately 2-3 cm below the bifurcation. This is almost in the midportion of the internal and is a separate and a distinct lesion. Risks and benefits for surgery versus stenting were reviewed in great detail, given the nature of his calcified lesion. It was elected to proceed with surgical intervention as his better choice. The patient agrees to proceed with surgery.   The patient is taken to the operating room and placed in the supine position. After adequate general anesthesia is induced, appropriate invasive monitors are placed, he is positioned with his neck extended slightly and rotated to the right. Left neck and chest wall are then prepped and draped in a sterile fashion. Appropriate timeout is called.   A curvilinear incision is created along the anterior margin of the sternocleidomastoid  muscle and in this situation, the inferior aspect is actually extended all the way down to the sternal notch. Dissection is carried through the soft tissues transecting the platysma.  The sternocleidomastoid muscle is reflected posterolaterally, exposing the omohyoid. The omohyoid is then dissected from its surrounding tissues and reflected anteromedially and the common carotid artery is identified at this level. Sitting posterior to the omohyoid is an easily palpable calcific hard area of plaque.  Below this, the artery feels fairly soft and clampable.  Above this, there is approximately a 6-10 mm segment of soft artery, but this would be right at the bifurcation and would be involved in the repair regardless, and therefore, it appears that the common carotid lesion must be treated as well. The dissection is carried further and further down extending to approximately 2.5 cm below the clavicle. The dissection is then carried anteriorly along the common carotid exposing the bifurcation. Femoral vein is ligated between 2-0 silk ties. Surgical clips are used to take down some of the small little venous branches and tributaries above that level as the dissection is extended toward the digastric. The hypoglossal nerve, as well as the vagal nerve, were both clearly identified and left undisturbed. A right angle is then used to pass Silastic vessel loops circumferentially around the superior thyroid artery as well as the external carotid artery. Heparin 7000 units is given and the internal carotid artery is dissected to a level above visible plaque formation.   Working with some difficulty given the depth behind the clavicle. The common carotid is clamped, followed by the external, followed by the internal carotid artery. Arteriotomy is made initially  and made at the soft spot distal to the common lesion and extended through the bifurcation and into the internal carotid artery.  A Javid shunt is then placed without  difficulty. Subsequently, some further dissection is made using Silastic vessel loops. The Javid shunt is repositioned more distally and subsequently a silastic vessel loop is used to secure it, thus allowing several millimeters of space below the common lesion that is proximal to the common lesion. The arteriotomy was then extended through the common lesion exposing this area as well.   Having reestablished flow to the brain, endarterectomy is then performed under direct visualization for the common lesion, as well as, the bulb and the internal carotid artery. External is treated with eversion technique. The internal carotid artery demonstrates marked redundancy with a partial kink and this is plicated reducing the length of the internal carotid artery by approximately 10-12 mm using a total of 7 interrupted Prolene sutures. This essentially tacked the distal intimal edge within this imbrication as well.   CorMatrix xenograft patch is then rehydrated on the back table and the arterial defect is repaired using the CorMatrix patch.  Only approximately 5 mm of patch is removed, as the arteriotomy is that long.  The patch is beveled and applied to repair the arterial defect using 6-0 Prolene in a 4-quadrant technique running each quadrant for closure. The shunt is subsequently removed.  Extensive flushing was performed and subsequently the suture line is completed. Flow was then re-established first to the external carotid artery and then the internal carotid artery to prevent distal embolization. Several points along the suture line were noted to have bleeding through this suture and therefore approximately 4 or 5 interrupted 6-0 Prolene were used to secure the patch at these points. The wound was then irrigated with 100 mL of saline, inspected for hemostasis. Surgicel and Evicel sealed were placed and subsequently the platysma was closed with running 3-0 Vicryl, followed by the 4-0 Monocryl for skin. The patient  tolerated the procedure well. There were no immediate complications. Sponge and needle counts were correct and he was awakened in the operating room, moving his left side well, answering simple commands and moving his right arm.  Right leg is slow to move, but was significantly weak from his previous stroke to the point where he is actually using is scooter for mobility, and therefore I believe he is at baseline.    ____________________________ Renford DillsGregory G. Breah Joa, MD ggs:ds D: 10/18/2013 12:45:18 ET T: 10/18/2013 14:22:17 ET JOB#: [id number]  cc: Renford DillsGregory G. Rieley Khalsa, MD, <Dictator> Steele SizerMark A. Crissman, MD Renford DillsGREGORY G Rowena Moilanen MD ELECTRONICALLY SIGNED 10/29/2013 12:23

## 2014-07-26 NOTE — Consult Note (Signed)
Patient is stable and wishes to proceed with surgery today.  Dr Noralyn Pickarroll with Anesthesia has had a long talk with the patient and family about his high risk status but feels that he at an acceptable status for surgery.  Because of his Plavix use a general anesthetic is needed. Dr Elpidio AnisSudini also has talked to them and cleared him for surgery.  Plan to proceed with hemiarthroplasty today.  Risks and benefits of surgery were discussed at length including but not limited to infection, non union, nerve or blood vessed damage, non union, need for repeat surgery, blood clots and lung emboli, and death again.    Electronic Signatures: Valinda HoarMiller, Slayde Brault E (MD)  (Signed on 18-Oct-15 10:01)  Authored  Last Updated: 18-Oct-15 10:01 by Valinda HoarMiller, Montie Gelardi E (MD)

## 2014-07-26 NOTE — Discharge Summary (Signed)
PATIENT NAME:  Larry Rice, Larry Rice MR#:  [id number] DATE OF BIRTH:  1940/07/21  DATE OF ADMISSION:  03/18/2014 DATE OF DISCHARGE:  03/20/2014  PRESENTING COMPLAINT: Altered mental status.   PRIMARY CARE PHYSICIAN:  Dr. Vonita MossMark Rice.   Patient was discharged to home with home health.   DISCHARGE DIAGNOSES:  1.  Acute on chronic obstructive pulmonary disease flare.  2.  Acute encephalopathy due to hypoxia improved.  3.  Hypertension.  4.  Bilaterally healed wounds which ar chronic.   CODE STATUS: FULL CODE.   MEDICATIONS:  1.  Atorvastatin 40 mg at bedtime.  2.  Anoro Ellipta 62.5/25 mcg inhalation 1 puff once a day.  3.  Gabapentin 300 mg once a day at bedtime.  4.  Tylenol Extra Strength 500 mg 2 tablets every six hours as needed.  5.  Levemir 50 units subcutaneous daily at bedtime.  6.  NovoLog 15 units t.i.d. with meals.  7.  Plavix 75 mg daily.  8.  Flomax 0.4 mg 1 capsule once a day after meals.  9.  Lisinopril 10 mg daily.  10.  Norco 10/325 mg one every four hours as needed.  11.  Calcium with vitamin D 500/200 mg one tablet b.i.d.  12.  Lotensin 10 mg 1 tablet three times a day as needed.  13.  Remeron 15 mg 1 tablet once a day at bedtime.  14.  Metoprolol 25 mg b.i.d.  15.  Lasix 20 mg daily.  16.  NAC 600 mg 1 capsule once a day.  17.  Ferrous sulfate 325 mg 1 tablets t.i.d.  18.  Vitamin D3 2000 international units p.o. daily.  19.  Duloxetine 60 mg 1 capsule daily.  20.  Metolazone 5 mg daily.  21.  Docusate 240 mg 1 capsule once a day.  22.  Fish oil 1000 mg orally 1 capsule three times a day.  23.  ProAir HFA 90 mcg/inhalations 2 puffs four times a day.  24.  Prednisone taper.  25.  Collagenase 250 units topical application once a day.   Physical home health PT, OT and nurse for dressing changes in the legs.   FOLLOWUP:   1.  Follow up with the wound clinic 1 to 2 weeks.  2.  Follow up with Dr. Dossie Rice in 1 to 2 weeks.  3.  Keep your vascular surgery  appointment with Dr. Gilda Rice.   LABORATORY DATA:  At discharge. Glucose was 84. Urinalysis negative for urinary tract infection. Cardiac enzymes were negative. White count is 8.9, H and H is 10.2 and 33.5, platelet count is 192, creatinine is 1.8.  BUN was 52. Wound culture grew a acinetobacter baumannii,  moderate growth of enterococcus faecalis, moderate growth of anaerobic Gram-positive rods.   BRIEF SUMMARY OF HOSPITAL COURSE:  Mr. Larry Rice is a 74 year old Caucasian gentleman who has multiple medical problems, came in with: 1.  Altered mental status which was highly concerning secondary to sedating medication in the setting of chronic obstructive pulmonary disease exacerbation with hypoxemia. 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  No indications for MI are present.  The patient now on p.o. steroids p.o. Levaquin nebulizers and inhalers. His mental status improved.  2.  Chronic obstructive pulmonary disease exacerbation. Continue duo nebulizers, steroids and levofloxacin.  3.  Chronic pain syndrome, resumed home medications.  4.  Hypertension. Back on home medications . bp appears  to be stable.  5.  Severe  peripheral vascular disease. The patient is supposed to have an angiogram of the lower extremities. Will follow up with Dr. Gilda Crease as outpatient. He continues Plavix.  6.  Type 2 diabetes, Levemir was continued on sliding scale.  7.  History of chronic atrial fibrillation, rate controlled. Not on any anticoagulation.  8.  Chronic kidney disease stage III-IV, creatinine remained stable, a baseline of 1.89.  9.  Chronic anxiety, depression. Resume Cymbalta, added Remeron per Dr. Shary Rice  recommendations, overall improved. The patient is discharged to home with home health. The discharge plan was discussed with the patient's wife.   CODE STATUS:  THE PATIENT REMAINED A FULL CODE.   TIME SPENT:  Forty minutes.     ____________________________ Larry Hail Larry Katz, Rice sap:at D: 03/31/2014 10:45:37 ET T: 03/31/2014 14:21:00 ET JOB#: [id number]  cc: Larry Rice A. Larry Katz, Rice, <Dictator> Larry Rice ELECTRONICALLY SIGNED 04/01/2014 18:38

## 2014-07-26 NOTE — Consult Note (Signed)
Brief Consult Note: Diagnosis: depression nos.   Patient was seen by consultant.   Consult note dictated.   Recommend further assessment or treatment.   Orders entered.   Comments: Psychiatry: PAtient seen and chart reviewed and note dictated. Patient with depression symptoms and morbid ruminations. No suicidality. Not a typical depression but mood is disturbed. Multiple factors involved including stroke history, medications including narcotics and steroids. Will suggest adding remeron for depression 30mg  qhs but not stopping cymbalta. Would avoid benzos. Will follow.  Electronic Signatures: Clapacs, Jackquline DenmarkJohn T (MD)  (Signed 16-Dec-15 12:11)  Authored: Brief Consult Note   Last Updated: 16-Dec-15 12:11 by Audery Amellapacs, John T (MD)

## 2014-07-27 NOTE — Op Note (Signed)
PATIENT NAME:  Larry Rice, Larry Rice MR#:  [id number] DATE OF BIRTH:  15-Jul-1940  DATE OF PROCEDURE:  08/02/2011  PREOPERATIVE DIAGNOSIS: Atherosclerotic occlusive disease bilateral lower extremities with lifestyle limiting claudication; and rest pain.   POSTOPERATIVE DIAGNOSIS: Atherosclerotic occlusive disease bilateral lower extremities with lifestyle limiting claudication; and rest pain.   PROCEDURES PERFORMED:  1. Abdominal aortogram.  2. Bilateral lower extremity distal runoff.   SURGEON: Renford DillsGregory Rice. Arretta Toenjes, MD  SEDATION: Versed 3 mg plus fentanyl 100 mcg administered IV. Continuous ECG, pulse oximetry and cardiopulmonary monitoring was performed throughout the entire procedure by the interventional radiology nurse. Total sedation time was 45 minutes.   ACCESS: Left common femoral artery 5 French sheath.   CONTRAST USED: Isovue 126 mL.   FLUOROSCOPY TIME: 2.9 minutes.   INDICATIONS: Mr. Larry Rice is a 74 year old gentleman who presented to the office with severe atherosclerotic occlusive disease and profound difficulty with walking as well as complaints of significant pain even while at rest. Physical examination as well as noninvasive studies demonstrated severe atherosclerotic changes throughout his lower extremities and he is undergoing angiography with the hope for intervention and/or mapping for surgical repair. The risks and benefits were reviewed, all questions are answered. Patient agrees to proceed.   DESCRIPTION OF PROCEDURE: Patient is taken to special procedures, placed in supine position. After adequate sedation is achieved, both groins are prepped and draped in sterile fashion. Ultrasound is placed in a sterile sleeve. Ultrasound is utilized secondary to lack of appropriate landmarks and to avoid vascular injury. The common femoral artery is identified. It is echolucent, homogeneous, and pulsatile indicating patency. Under direct ultrasound visualization after recording an image  a micropuncture needle is inserted into the common femoral artery, microwire followed by micro sheath, J-wire and pigtail catheter are then advanced. There is difficulty negotiating very tortuous calcified iliacs and a floppy Glidewire is utilized with the pigtail catheter. Pigtail catheter is then advanced to level of T12 and bolus injection of contrast is utilized to demonstrate the aorta and iliacs in the AP projection. Pigtail catheter is then repositioned to above the bifurcation and bilateral oblique views are obtained. The II is then returned to the AP projection and bilateral distal runoff is obtained. After magnified oblique views of both groins are obtained wire is reintroduced. The pigtail catheter and wire are removed. The sheath is pulled, pressure is held, and there are no immediate complications.   INTERPRETATION: The abdominal aorta is opacified with a bolus injection of contrast. There are single renal arteries noted. No evidence of renal artery stenosis. Normal appearing nephrograms. Small abdominal aortic aneurysm is identified in the infrarenal location. There is diffuse heavily calcified disease throughout the entire arterial system which is plainly visible in the iliac as well as femoral and superficial femoral systems on plain fluoro. There are high-grade stenoses of both common iliac arteries greater than 75%. There is occlusion of the right external iliac artery.   There is occlusion of the right common femoral and superficial femoral. The profunda femoris is occluded at its origin but reconstitutes several centimeters distally. There appears to be very poor reconstitution of the popliteal in its midportion.   The left common femoral is patent but heavily diseased including the external iliac and will be inadequate for closure. Profunda femoris is patent but appears to be significantly collateralized. Superficial femoral artery is occluded. There is below knee reconstitution of the  popliteal.   SUMMARY: Diffuse iliac occlusive disease as described. Occlusion of the right common  femoral and severe disease of the left common femoral. There is reconstitution of the profundus fairly distal in their courses bilaterally. Bilateral SFA occlusions. This patient is a candidate for an aortobifemoral but it will be a very extensive operation and there is question as to the patency and durability of the left limb and therefore this will be discussed in detail with the patient and family prior to committing to any surgical repair.   ____________________________ Renford Dills, MD ggs:cms D: 08/02/2011 12:58:09 ET T: 08/02/2011 13:15:50 ET  JOB#: [id number] cc: Renford Dills, MD, <Dictator> Steele Sizer, MD Renford Dills MD ELECTRONICALLY SIGNED 08/12/2011 7:52

## 2014-07-27 NOTE — Discharge Summary (Signed)
PATIENT NAME:  Larry Rice, [name] MR#:  [id number] DATE OF BIRTH:  1940-07-02  DATE OF ADMISSION:  04/25/2011 DATE OF DISCHARGE:  04/29/2011  PRESENTING COMPLAINT: Shortness of breath and cough.   DISCHARGE DIAGNOSES:  1. Pneumonia.  2. Chronic atrial fibrillation, now on Coumadin.  3. Hypertension.  4. Type II diabetes.  5. History of stroke in the past with right upper extremity weakness.   CONDITION ON DISCHARGE: Fair. Sats are 94% on room air.   MEDICATIONS:  1. Allopurinol 100 mg daily.  2. Isosorbide 120 mg extended-release p.o. daily.  3. Aspirin 81 mg p.o. daily.  4. Simvastatin 40 mg p.o. daily at bedtime.  5. Hydrochlorothiazide/lisinopril 25/20 1 tablet daily.  6. Clonidine 0.1 mg p.o. daily.  7. Lasix 80 mg p.o. daily.  8. Metoprolol 100 mg b.i.d.  9. Venlafaxine 37.5 mg 1 tablet twice a day.  10. Ranitidine 300 mg p.o. at bedtime.  11. Humalog 12 units once a day in the evening.  12. Lantus 88 units at bedtime.  13. Coumadin 2 mg at 5 p.m.  14. Spiriva 1 capsule inhalation daily.  15. Advair 250/50 one puff b.i.d.  16. Zithromax 250 mg p.o. daily for one more day.  17. Keflex 500 mg p.o. t.i.d. for three days.   FOLLOW-UP:  1. Follow-up with Dr. America BrownFath's office for PT-INR check Monday, January 28th, at 9 a.m.  2. Follow-up with Dr. Dossie Arbourrissman on January 31st at 1 p.m.   LABORATORY, DIAGNOSTIC, AND RADIOLOGICAL DATA: PT-INR today is 14.5 and 1.1. CBC within normal limits. Creatinine 1.82. Glucose 99. Echo Doppler showed moderately reduced left ventricular systolic function, left atrium is mildly enlarged, EF of 30 to 35%, aortic valve opens well, mitral valve is grossly normal, mild mitral regurgitation. Magnesium 1.8. Hemoglobin A1c 9.7. Cholesterol 93, triglycerides 275, HDL 13, LDL 25. Sputum heavy growth routine respiratory flora. Legionella urine was negative. Influenza A plus B was negative. Blood cultures negative in 36 hours. Streptococcus pneumoniae antigen was  negative. B-type Natriuretic Peptide 8072. Chest x-ray consistent with right lower lobe infiltrate.   BRIEF SUMMARY OF HOSPITAL COURSE: Larry Rice is a pleasant 74 year old Caucasian gentleman who was admitted with:  1. Right lower lobe pneumonia and mild chronic obstructive pulmonary disease exacerbation. The patient was started on steroids and IV antibiotics. He was started on Rocephin and Zithromax. His wheezing improved. His steroids were tapered off. He was changed to p.o. Keflex and Zithromax which he will complete as outpatient. The patient's white count was back to normal. His Advair and Spiriva were continued along with p.r.n. DuoNebs.  2. Chronic systolic heart failure. Echo showed EF of 30 to 35% on Lasix. The patient had no evidence of acute congestive heart failure during this admission. His home dose of Lasix was resumed and his lisinopril was also started.  3. Elevated troponin, mild borderline, appeared to be due to demand ischemia in the setting of pneumonia and cardiomyopathy. The patient's statins, ACE inhibitors, Imdur, and metoprolol were continued. Dr. Lady GaryFath recommended continuing present medical treatment.  4. Acute on chronic atrial fibrillation. The patient's [place] score was high and, hence, started on low dose Coumadin. PT-INR will be followed by Dr. Lady GaryFath as outpatient. Appointment has already been made.  5. Chronic kidney disease, stage III. Creatinine was stable prior to discharge.  6. Hyperlipidemia. The patient is on statin which was continued.  7. Gout, remained stable. The patient is on allopurinol.   Hospital stay otherwise remained stable.   CODE  STATUS: The patient remained a FULL CODE.       TIME SPENT: 40 minutes.   ____________________________ Wylie Hail Allena Katz, MD sap:drc D: 04/29/2011 13:22:03 ET T: 04/29/2011 14:17:22 ET JOB#: [id number]  cc: Ambrose Wile A. Allena Katz, MD, <Dictator> Darlin Priestly. Lady Gary, MD Steele Sizer, MD Willow Ora MD ELECTRONICALLY SIGNED  05/07/2011 15:38

## 2014-07-27 NOTE — Consult Note (Signed)
General Aspect the patient is a 74 year old male with history of chronic kidney disease, history of myocardial infarction, history of diabetes, hypertension and hyperlipidemia as well as a prior cerebrovascular accident who is admitted via his primary care doctor's office after presenting with complaints of nausea, vomiting, cough, fever. He was noted to be in atrial fibrillation with fairly rapid ventricular response. Patient had a stroke several years ago with some residual weakness. [place]. On presentation he was noted to have atrial fibrillation with rapid ventricular response with a mild serum troponin elevation. He denied any chest pain but complained of cough and shortness of breath. He denies being told he had atrial fibrillation in the past but had an irregular heartbeat in the past. He is improved with oxygen, antibiotics as well as heparin therapy. His serum troponin initially was 0.12 and currently is 0.45. His renal function has improved slightly. On admission his serum creatinine was 2.1 and improved to 1.86. He currently is hemodynamically stable. He denies any symptoms of his coronary artery disease similar to what he had with his myocardial infarction. Records from the previous MRI are not currently available. It appears that he was treated medically for this event.   Physical Exam:   GEN obese    HEENT PERRL    NECK supple    RESP rhonchi    CARD Irregular rate and rhythm  Tachycardic  Murmur    Murmur Systolic    ABD denies tenderness  normal BS    LYMPH negative neck    EXTR negative cyanosis/clubbing, positive edema    SKIN normal to palpation    NEURO cranial nerves intact, R side weakness    PSYCH A+O to time, place, person   Review of Systems:   Subjective/Chief Complaint cough, shortness of breath, weakness, fatigue, diarrhea, nausea    General: Fatigue  Weakness    Skin: No  Complaints    ENT: No Complaints    Eyes: No Complaints    Neck: No Complaints    Respiratory: Frequent cough  Short of breath  Sputum    Gastrointestinal: Nausea  Diarrhea  Rectal Bleeding    Genitourinary: No Complaints    Vascular: No Complaints    Musculoskeletal: No Complaints    Neurologic: No Complaints    Hematologic: No Complaints    Endocrine: No Complaints    Psychiatric: No Complaints    Review of Systems: All other systems were reviewed and found to be negative    Medications/Allergies Reviewed Medications/Allergies reviewed     cva: right reidual   chf:    Hypercholesterolemia:    Diabetes Mellitus,Type I (IDD):    htn:    denies:   Home Medications:  levofloxacin 750 mg tablet: 1 tab(s) orally every 24 hours x 5 days, Active  allopurinol 100 mg oral tablet: 1 tab(s) orally once a day, Active  isosorbide mononitrate 120 mg oral tablet, extended release: 1 tab(s) orally once a day (in the morning), Active  aspirin 81 mg oral delayed release tablet: 1 tab(s) orally once a day, Active  simvastatin 40 mg oral tablet: 1 tab(s) orally once a day (at bedtime), Active  hydrochlorothiazide-lisinopril 25 mg-20 mg oral tablet: 1 tab(s) orally once a day, Active  clonidine 0.1 mg oral tablet: tab(s) orally once a day, Active  furosemide 80 mg oral tablet: 1 tab(s) orally once a day, Active  metoprolol tartrate  100 mg oral tablet: 1 tab(s) orally 2 times a day, Active  venlafaxine 37.5 mg oral tablet: 1 tab(s) orally 2 times a day, Active  ranitidine 300 mg oral capsule: 1 cap(s) orally once a day (at bedtime), Active  Humalog 100 units/mL subcutaneous solution: 12 unit(s) subcutaneous once a day (in the evening) at supper., Active  Lantus 100 units/mL subcutaneous solution: 88 unit(s) subcutaneous once a day (at bedtime), Active  EKG:   Abnormal NSSTTW changes    Interpretation atrial fibrillation with variable ventricular response. No ischemia    No  Known Allergies:     Impression the patient is a 74 year old male with history of prior cerebrovascular accident, history of congestive heart failure, history of diabetes, history of hyperlipidemia, hypertension as well as a prior apparent myocardial infarction. He presented with cough, shortness of breath, nausea vomiting and diarrhea. On presentation to the emergency room he was noted to be in atrial fibrillation with variable ventricular response but initially somewhat rapid. He also was noted to have right lower lobe infiltrate consistent with pneumonia. His serum troponin was mildly elevated at 0.1-1 presentation increased to 0.45 after admission. He denied any chest pain. He improved with oxygen, antibiotics, and was placed on heparin. He does complain of some rectal bleeding but denies melena. His CHADSS 2 score is 5. He denies any history of melena, frequent falls, GI bleeding. He does complain of rectal bleeding on occasion with bright red blood consistent with hemorrhoids. He denies being told he had atrial fibrillation in the past but has been aware of irregular heartbeat for many years. He denies any chest pain similar to his angina. His serum troponin elevation appears to be related to demand ischemia in the face of chronic kidney disease. His renal function has improved somewhat since admission. There is no clinical evidence of congestive heart therapy present time. Given his elevated CHADSS or as well as persistent atrial fibrillation, and no absolute contraindication to chronic anticoagulation. Would recommend considering placement on warfarin therapy. He states he was on this at some point in the past around the time of his stroke however is not aware of why this was discontinued.    Plan 1. Continue with aggressive treatment of his pneumonia 2. Would not proceed with cardiac catheterization at this time given probable demand ischemia rather than an acute myocardial infarction, renal  insufficiency, current treatment for pneumonia. 3. Would place on warfarin therapy at 2 mg daily given his antibiotic therapy. Will follow INR as outpatient 4. Continue with rate control with beta blockers as you're doing 5.given cardiomyopathy with echo evidence of an EF of 30-35%, consideration for afterload reduction with ACE inhibitors should be raised if acceptable from a nephrology standpoint 6. Low-sodium diet 7. Further recommendations pending course however would not proceed with any invasive cardiac evaluation at this time. Further recommendations as outpatient   Electronic Signatures: Dalia Heading (MD)  (Signed 23-Jan-13 04:07)  Authored: General Aspect/Present Illness, History and Physical Exam, Review of System, Past Medical History, Home Medications, EKG , Allergies, Impression/Plan   Last Updated: 23-Jan-13 04:07 by Dalia Heading (MD)

## 2014-07-27 NOTE — H&P (Signed)
PATIENT NAME:  Larry Rice, Larry Rice MR#:  [id number] DATE OF BIRTH:  1940/04/19  DATE OF ADMISSION:  04/25/2011  REFERRING PHYSICIAN: Dr. Mayford Knife   PRIMARY CARE PHYSICIAN: [facility]   NEPHROLOGIST: Dr. Thedore Mins    CHIEF COMPLAINT: Referred here from primary care physician's office for possible pneumonia.   HISTORY OF PRESENT ILLNESS: The patient is a 74 year old Caucasian male with history of CVA with right-sided residual weakness, congestive heart failure unknown baseline, chronic kidney disease and follows with Dr. Thedore Mins, coronary artery disease status post mild MI in 2008, hypertension, and diabetes who presents with shortness of breath and nonproductive cough. The patient apparently presented to her outpatient primary care physician's office and was promptly sent here. The patient states that he has been having nausea, vomiting, and diarrhea for about a week. The diarrhea is improving, however, the nausea and vomiting also improved initially but he has had several bouts last night. The diarrhea is loose. The patient has several family members with similar symptoms of nausea, vomiting, and diarrhea, namely the wife and some grandchildren have been struck by "GI bug". The shortness of breath has also started recently. The patient denies having any fevers or chills, however, last night felt hot and cold. The patient has some dyspnea on exertion. While in the ER, the patient was found with some leukocytosis and was found with elevated troponin of 0.45 and started on heparin drip and hospitalist services were contacted for further evaluation and management. On further conversation, the patient stated that he has been having no chest pains, however, had indigestion and gas the middle of last week. He has had MI in the past, however, he states that he has no symptoms as the symptoms from that MI.   PAST MEDICAL HISTORY:  1. Cerebrovascular accident in 2003 with right-sided  weakness. 2. Congestive heart failure, unknown baseline. 3. Gout. 4. Diabetes, on insulin.  5. Hypertension.  6. Coronary artery disease, status post mild MI. 7. Chronic kidney disease who states his percentage is in the 40's, likely his GFR. 8. Chronic obstructive pulmonary disease, not on any inhalers.  9. Hyperlipidemia.   ALLERGIES: No known drug allergies.   SOCIAL HISTORY: Quit tobacco abuse 20 years ago. No alcohol or other drug use. He was a 30+ pack-year smoker.   FAMILY HISTORY: Mom with diabetes and hypertension. Dad with MI and coronary artery disease.  MEDICATIONS: 1. Allopurinol 100 mg daily.  2. Aspirin 81 mg daily.  3. Clonidine 0.1 mg. 4. Furosemide 40 mg daily.  5. Humalog 12 units at bedtime.  6. Hydrochlorothiazide/lisinopril 25/20 mg 1 tab daily.  7. Isosorbide mononitrate 120 mg extended-release daily.  8. Lantus 88 units at bedtime.  9. Metoprolol 100 mg 2 times a day.  10. Ranitidine 300 mg daily.  11. Simvastatin 40 mg daily.  12. Venlafaxine 37.5 mg b.i.d.   REVIEW OF SYSTEMS: CONSTITUTIONAL: No fever or fatigue. There are no weight changes. EYES: No blurry vision or double vision. ENT: No tinnitus or hearing loss. No snoring or sinus pain. RESPIRATORY: Nonproductive cough. Mild wheezing. Some dyspnea on exertion. No orthopnea. CARDIOVASCULAR: No chest pains. No edema currently but has had swelling in his legs prior. No history of arrhythmia. Some dyspnea on exertion. No palpitations or syncope. GI: History of nausea, vomiting, and diarrhea recently as above. No abdominal pain. No melena or rectal bleeding currently. The patient has had episodes of bloody stools in the past. Has history of hemorrhoids. GU: No dysuria or hematuria.  ENDOCRINE: No polyuria or nocturia. No thyroid problems. There is some increased sweating, however. HEME/LYMPH: No anemia or easy bruising. SKIN: No rashes. MUSCULOSKELETAL: There is some arthritis. NEUROLOGIC: No numbness or  dementia. Has history of CVA in the past. PSYCH: No anxiety. Has history of depression.   PHYSICAL EXAMINATION:   VITAL SIGNS: Temperature 97.7, pulse 72, respiratory rate 18, blood pressure 134/74, oxygen sat 97% on oxygen currently.  GENERAL: The patient is an obese Caucasian male laying in bed in no obvious distress talking in full sentences, slightly diaphoretic.   HEENT: Normocephalic, atraumatic. Pupils are equal and reactive. Anicteric sclerae. Moist mucous membranes.   NECK: Supple. Unable to appreciate any JVD. No thyroid tenderness.   CARDIOVASCULAR: S1, S2 regular rate and rhythm. No murmurs, rubs, or gallops.   LUNGS: There is mild scattered wheezing, some rales in the right base.   ABDOMEN: Soft, nontender, nondistended. Positive bowel sounds. There is a healed oblique right upper quadrant scar.   EXTREMITIES: No significant edema.   NEUROLOGIC: Cranial nerves II through XII grossly intact. Strength is 4 out of 5 in the right upper and right lower extremity, 5 out of 5 on the left side. Sensation intact to light touch.   PSYCH: Awake, alert, oriented x3.  LABORATORY, DIAGNOSTIC, AND RADIOLOGICAL DATA: BNP elevated at 8072. Glucose 220, BUN 43, creatinine 2.16, chloride 96, GFR 32, total protein 7.5, albumin 2.7, bilirubin 0.6, AST 24, ALT 30. CK-MB 4.4. Troponin 0.45. WBC 15.4, hemoglobin 14.4, hematocrit 44.2, platelets 204.   X-ray of the chest, PA and lateral, showing right lower lobe infiltrate. EKG AFIB. There is nonspecific intraventricular block and prolonged QRS duration. No ST elevations appreciated on the EKG, some Q waves.   ASSESSMENT AND PLAN: We have a 74 year old Caucasian male with history of coronary artery disease status post MI, CVA, congestive heart failure unknown baseline, CKD, hypertension, and diabetes with pneumonia with shortness of breath, also with troponins. Unclear if this is from acute coronary syndrome versus demand ischemia from pneumonia with  CKD, nausea, vomiting, and diarrhea possibly viral gastroenteritis. Would admit the patient to telemetry given the positive troponins.  1. For his shortness of breath, this is likely in the setting of pneumonia, however, he also has some elevation of his BNP. He will be treated with ceftriaxone and azithromycin. Would panculture the patient as well as check rapid flu. Would also check urine strep and Legionella antigens and provide supplemental oxygen with some nebs as the patient has some slight wheezing. He does have chronic obstructive pulmonary disease, however, he is on no inhalers. He might need outpatient pulmonary function tests to figure out the level of his chronic obstructive pulmonary disease and might need to be placed on Spiriva or Advair while hospitalized. In regard to his positive troponins, this could be demand ischemia again versus NSTEMI, however, the patient denies any chest pains. The patient has been started on heparin drip which I would continue. I would also continue the aspirin, statin, beta-blocker, and ACE inhibitor. I will check echocardiogram. Would also start the patient on morphine and nitroglycerin sublingual as needed. Get a Cardiology consult. Unclear if AFIB is new or not but he states he does have irregular heart rythem but not sure what. 2. For his nausea, vomiting, and diarrhea, I would start the patient on Zofran as needed and check stool studies. This might be viral in nature as it has inflicted multiple family members. I would check a rapid flu.   3.  For his diabetes, I will check a hemoglobin A1c and resume his outpatient medications.  4. For his CKD, the patient states that the percentage of his kidneys is in the 40's which I presume means his GFR, however, I don't have any other labs here. We can consider a Nephrology consult if the patient has worsening of his renal function.  5. For his history of cerebrovascular accident, I would continue the aspirin and statin.   6. For DVT prophylaxis, he is already on heparin drip and I would start him on Protonix as well for GI prophylaxis.   CODE STATUS: FULL CODE.   TOTAL TIME SPENT: 55 minutes.   ____________________________ Krystal Eaton, MD sa:drc D: 04/25/2011 19:17:40 ET T: 04/26/2011 05:56:39 ET JOB#: [id number]  cc: Krystal Eaton, MD, <Dictator>, [facility] Krystal Eaton MD ELECTRONICALLY SIGNED 05/03/2011 18:41

## 2014-07-30 NOTE — Op Note (Signed)
PATIENT NAME:  Larry Rice, Larry Rice MR#:  [id number] DATE OF BIRTH:  Jul 24, 1940  DATE OF PROCEDURE:  03/21/2014  PREOPERATIVE DIAGNOSIS: Atherosclerotic occlusive disease, bilateral lower extremities with ulceration of the right heal.   PREOPERATIVE DIAGNOSES: Atherosclerotic occlusive disease, bilateral lower extremities with ulceration of the right heal.   PROCEDURE PERFORMED:  1.  Introduction of catheter into the aorta. 2.  Percutaneous and transluminal angioplasty and stent  placement, left common iliac artery.  3.  Percutaneous transluminal angioplasty and stent placement, left external iliac artery.  4.  Minx closure, left common femoral puncture site. .   SURGEON: Renford DillsGregory Rice. Schnier, MD   SEDATION: Versed plus fentanyl. Continuous ECG, pulse oximetry and cardiopulmonary monitoring is performed throughout the entire procedure by the interventional radiology nurse. Total sedation time is 1 hour and 50 minutes.   ACCESS: A 6 French sheath, left common femoral artery.   FLUOROSCOPY TIME: 27.7 minutes.   CONTRAST USED: Isovue 95 mL.   INDICATIONS: Mr. Larry Rice is a 74 year old gentleman with multiple profound medical problems, including severe respiratory dysfunction, COPD, home oxygen dependence as well as severe coronary disease. He is status post exploratory laparotomy and was found to be non reconstructible at that time. Attempts at creating an aortobifemoral bypass graft were stopped. At this point, he has now developed an ulceration of his heel, and therefore requires revascularization for limb salvage. The risks and benefits for angiography and possible intervention were reviewed. All questions were answered. The patient agrees to proceed.   DESCRIPTION OF PROCEDURE: The patient is taken to special procedures and placed in the supine position. After adequate sedation is achieved, left groin was prepped and draped in a sterile fashion. An appropriate timeout is called.   Ultrasound  is placed in a sterile sleeve. Ultrasound is utilized to evaluate the common femoral artery. Common femoral artery demonstrates profound plaque throughout its course. More proximally, within several millimeters of the circumflex vessels, there is a relatively spared area, and this is selected for access under direct ultrasound visualization. After images were recorded, a micropuncture needle is inserted. Microwire is then advanced, and a micro sheath is placed. Wire would not advance past the pelvic brim, and therefore hand injection of contrast was utilized and demonstrates occlusion of the external iliac. Ultimately, using a combination of catheters, as well as Glidewires, the wire and catheter are negotiated through the occlusion of the external iliac as well as the occlusion of the common iliac, into the aorta and hand injection of contrast is utilized to demonstrate intraluminal placement.  A 6 French sheath is inserted. Pigtail catheter is placed and positioned above the renals. AP projection of the aorta is obtained and then an end RAO projection of the pelvis is obtained. After review of these images, there does appear to be an area of moderate stenosis in the aorta below the renals, as well as the noted occlusion of the common iliac, as well as the external.    An Amplatz Super Stiff wire was then advanced through the pigtail catheter, and initially a 6 x 6 Lutonix  balloon is used to angioplasty the common femoral; this was inflated to 12 atmospheres for 3 minutes, and then a 5 x 10 Lutonix balloon was used to angioplasty the external iliac. Again, 12 atmospheres for 3 minutes. Improvement is nominal, and therefore it is elected to stent  these areas. A 10 x 40 LifeStar stent is deployed across the common iliac lesion. It is post dilated to 8  mm with good result. A 7 x 80 LifeStar stent is deployed across the external and post dilated to first 6 mm and then 7 mm; however, in the midportion, there is  still a high-grade lesion and therefore a 7 x 29 Omnilink stent is deployed; this is a balloon expandable stent, and this does achieve significant luminal gain. At this point, transducer is used to pressures at the level of the common femoral. The pressure is 150 to 160 systolic, with a cuff reading of 170. Because this appears to be quite minimal gradient, I have elected to terminate the procedure. Oblique view of the groin is obtained, and then a Mynx device is deployed.   INTERPRETATION: Initial images from within the aorta demonstrate there is a moderate to severe stenosis noted across the aorta, just below the renals. 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 There is occlusion of the common and external iliacs on the right, with no evidence of flow on the left. On the left, there is subtotal occlusion, but reconstitution of flow via collaterals. On the left, the common femoral demonstrates a greater than 80% narrowing in its and distal thirds. There is occlusion of the SFA. Profunda femoris demonstrates extensive disease as well. The right is nonvisualized.   Following angioplasty and stent placement as described above, there is flow with apparent near normalization of the gradient.   SUMMARY: Successful recanalization of the left iliac system. This does allow potential for a femoral-femoral bypass under spinal. Imaging of the right will be obtained at a later date, as the patient has severe renal insufficiency and undergoing a contrast load secondarily was ill advised.    ____________________________ Renford Dills, MD ggs:MT D: 03/22/2014 13:56:40 ET T: 03/22/2014 15:45:09 ET JOB#: [id number]  cc: Renford Dills, MD, <Dictator> Renford Dills MD ELECTRONICALLY SIGNED 04/15/2014 17:30

## 2014-08-03 NOTE — H&P (Signed)
PATIENT NAME:  Larry Rice, Larry Rice MR#:  [id number] DATE OF BIRTH:  December 17, 1940  DATE OF ADMISSION:  05/07/2014  PRIMARY CARE PROVIDER:  Dr. Vonita MossMark Crissman.   CARDIOLOGIST:  Dr. Lady GaryFath.   CHIEF COMPLAINT: Shortness of breath, lower extremity swelling, and low oxygen levels.   HISTORY OF PRESENT ILLNESS:  A 74 year old Caucasian male patient with history of chronic systolic CHF with EF of 25%-30%, advanced COPD, chronic respiratory failure on 2 liters oxygen, CAD status post CABG, presents to the Emergency Room brought in by family after he was noticed to have low saturations in the wound care center. The patient has bilateral ulcers on his feet with peripheral arterial disease for which he had a routine appointment at the wound care center. There after noticing low saturations he went up to the Emergency Room. Here he mentions that he has had worsening shortness of breath over the past few days along with lower extremity swelling. Also his saturations were low into the 70s in the Emergency Room.   The patient was recently started on metolazone as needed for worsening lower extremity edema. He was supposed to get an echocardiogram and a stress test as outpatient next week with Dr. Lady GaryFath for further evaluation and preop checkup for lower extremity bypass surgery.   PAST MEDICAL HISTORY:  1. Hypertension.  2. CAD status post CABG.  3. Diabetes mellitus on insulin.  4. CKD stage number.  5. COPD.   6. Chronic respiratory failure on 2 liters oxygen.  7. CVA with right hemiparesis.  8. Left carotid endarterectomy July 2015.  9. Hyperlipidemia.  10. Chronic systolic CHF with EF of 25%-30%.  11. Peripheral arterial disease.  12. Bladder cancer status post surgery 7 years back.   PAST SURGICAL HISTORY: 1. Cholecystectomy.  2. CABG.  3. Surgery for bladder cancer.  4. Left carotid endarterectomy.   ALLERGIES: No drug allergies.   SOCIAL HISTORY: The patient is married, lives at home with wife. Used  to walk with a walker, but presently is mostly in bed due to his weakness and shortness of breath. History of smoking in the past but quit 17 years back. No alcohol. No illicit drug use.   CODE STATUS: Full code.   FAMILY HISTORY:  CVA and hypertension.   REVIEW OF SYSTEMS:  CONSTITUTIONAL: Complains of fatigue.  EYES: No blurred vision, pain. or redness.  EARS, NOSE, AND THROAT: No tinnitus, ear pain, hearing loss.  RESPIRATORY: Has cough, wheezing.  CARDIOVASCULAR: No chest pain. Does have orthopnea and lower extremity edema.  GASTROINTESTINAL: No nausea, vomiting, abdominal pain.  GENITOURINARY: No dysuria, hematuria. Has decreased urination.  MUSCULOSKELETAL: Has chronic back pain, lower extremity pain.  SKIN: Has bilateral feet ulcers.  NEUROLOGIC: No focal numbness or weakness.  Has history of stroke.   HOME MEDICATIONS:  1. Aldactone 25 mg daily.  2. Lisinopril 10 mg daily.  3. Baclofen 10 mg 3 times a day as needed.  4. Calcium with vitamin D 1 tablet daily.  5. Plavix 75 mg daily.  6.   Cardizem 240 mg oral once a day.  7. Duloxetine 60 mg daily.  8. Ferrous sulfate 325 mg oral 2 times a day.  9. Fish oil 1000 mg 2 times a day.  10. Lasix 20 mg 2 times a day.  11. Gabapentin 300 mg oral once a day.  12. Levemir 25 units subcutaneous once a day at bedtime.  13. Lisinopril 10 mg daily.  14. Metolazone 5 mg oral once a  day as needed.  15. Metoprolol tartrate 25 mg oral 2 times a day.  16. Remeron 15 mg daily.  17. NovoLog FlexPen 12-15 units subcutaneous 3 times a day as needed for blood sugars. 18. Tamsulosin 0.4 mg once a day.  19. Anoro Ellipta 1 puff inhaled once a day.  20. Acetaminophen-hydrocodone 325-10 one tablet every 4 hours as needed for pain.   PHYSICAL EXAMINATION:  VITAL SIGNS: Temperature 98.3, pulse of 82, blood pressure 122/74, saturating 99% on 3 liters oxygen.  GENERAL: Obese Caucasian male patient, lying in bed in significant respiratory distress,  drowsy.  PSYCHIATRIC: Drowsy, but wakes up on calling him, oriented, pleasant.  HEENT: Atraumatic, normocephalic. Oral mucosa moist and pink. Pallor positive. No icterus. Pupils bilaterally equal and reactive to light.  NECK: Supple. No thyromegaly. No palpable lymph nodes. Trachea midline. No carotid bruit or JVD.   CARDIOVASCULAR: S1, S2. Systolic murmur. Peripheral pulses decreased with 3 + edema in the lower extremities.  RESPIRATORY: Has bilateral wheezing, crackles.  GASTROINTESTINAL: Soft abdomen, nontender. Has a scar from prior surgery ventral hernia. Bowel sounds present.  GENITOURINARY: No CVA tenderness or bladder distention.  SKIN: Warm and dry. Has some erythema in the lower extremities. Has bilateral feet ulcers under dressing.  NEUROLOGIC: Has right hemiparesis with left motor strength of 5 - /5 in upper and lower extremities.  LYMPHATICS: No cervical lymphadenopathy.   LABORATORY STUDIES: BNP of 30,000. Glucose 243, BUN 54, creatinine 2.06, sodium 132, potassium 5.2, chloride 95. AST, ALT, alkaline phosphatase, bilirubin normal. Troponin 0.03, CK of 281. WBC 9.7, hemoglobin 10.7, platelets 198,000. INR 1.4. ABG showed pH of 7.4 with pCO2 of 46 and pO2 of 38 on 2 liters oxygen.   EKG shows atrial fibrillation with occasional PVCs.   Chest x-ray, prominent lung markings with possible chronic changes, no frank pulmonary edema or air space disease, stable cardiomegaly.  No pleural effusions.   ASSESSMENT AND PLAN:  1.  Acute on chronic respiratory failure with chronic obstructive pulmonary disease exacerbation and acute on chronic systolic congestive heart failure. The patient will be started on IV Lasix, monitor Is and Os, daily weights. We will also give him Solu-Medrol 125 mg stat dose and add DuoNeb nebulizer therapy now. We will continue this q. 4 hours. The patient seems to be progressively worsening over the past few months with his respiratory status, which I have discussed  with the family. They would like him to be full code and be on a ventilator if needed, but not for a prolonged time, although they are unable to decide at this point if they want him to be full code or DNR/DNI which the patient seems to have expressed in the past. Presently the patient is drowsy, unable to make decisions. We will admit to stepdown. BiPAP if needed. The patient is critically ill. Although there is no pulmonary edema on chest x-ray he does have crackles, has lower extremity edema along with orthopnea.  2.  Chronic kidney disease stage III.  This seems to be progressively worsening with time. Presently the patient will be on dialysis. This might worsen and needs to be monitored. 3.  Diabetes mellitus. Put him on home dose of Lantus and sliding scale insulin.  4.  History of cerebrovascular accident, stable.  5.  Deep vein thrombosis prophylaxis. Start heparin.   CODE STATUS:  Full code.   TIME SPENT ON THIS CASE: 50 minutes of critical care time.     ____________________________ Molinda Bailiff Leasha Goldberger, MD  srs:bu D: 05/07/2014 15:15:10 ET T: 05/07/2014 15:43:21 ET JOB#: [id number]  cc: Wardell Heath R. Lynsee Wands, MD, <Dictator> Steele Sizer, MD Darlin Priestly Lady Gary, MD Orie Fisherman MD ELECTRONICALLY SIGNED 05/12/2014 3:57

## 2014-08-03 NOTE — Discharge Summary (Signed)
PATIENT NAME:  Larry Rice, GRABILL MR#:  [id number] DATE OF BIRTH:  09-29-1940  DATE OF ADMISSION:  05/07/2014 DATE OF DISCHARGE:  05/10/2014  ADMITTING DIAGNOSIS: Congestive heart failure.   DISCHARGE DIAGNOSES:   1.  Acute on chronic respiratory failure with hypoxia.  2.  Acute on chronic systolic congestive heart failure.   3.  Cardiomyopathy with ejection fraction of 30% to 35%.  4.  Chronic obstructive pulmonary disease exacerbation.  5.  Acute bronchitis.  6.  Chronic kidney disease.  7.  Diabetes mellitus type 2, insulin-dependent, with hemoglobin A1c 7.8.  8.  Stroke with right hemiparesis.  9.  Lower extremity cellulitis.  10.  Bilateral heel ulcers present on admission; left heel stage 3, right heel unstageable.  11.  Stage 2 right buttock  pressure ulcer, present on admission.  12.  History of hypertension and coronary artery disease, status post coronary artery bypass grafting.  13.  Chronic respiratory failure, on 2 L of oxygen through nasal cannula at home.  14.  History of left cerebrovascular accident.  15.  Hyperlipidemia.  16.  Peripheral arterial disease . 17.  Obesity.   DISCHARGE CONDITION: Stable.   DISCHARGE MEDICATIONS:  1.  The patient is to continue atorvastatin 40 mg p.o. at bedtime.  2.  Calcium with vitamin D 500/200, 1 tablet twice daily.  3.  Baclofen 10 mg 3 times daily as needed.  4.  Remeron 15 mg p.o. once at bedtime.  5.  Metoprolol tartrate 25 mg p.o. twice daily.  6.  Anoro Ellipta 1 puff once daily.  7.  ProAir HFA 2 puffs twice daily as needed.  8.  Plavix 75 mg p.o. daily.  9.  Vitamin D3, 2000 units once daily.  10.  Docusate calcium 240 mg p.o. at bedtime.  11.  Duloxetine 60 mg p.o. daily.  12.  Iron Sulfate 325 mg p.o. twice daily.  13.  Fish oil 1 g twice daily.  14.  Gabapentin 300 mg p.o. at bedtime.  15.  Lisinopril 10 mg p.o. daily.  16.  Metolazone 5 mg p.o. daily as needed.  17.  Acetaminophen hydrocodone 325 mg/10 mg 1  tablet every 4 hours as needed.  18.  NovoLog Flex Pen 12 to 15 units subcutaneously 3 times daily as needed.  19.  Tamsulosin 0.4 mg p.o. daily.  20.  Tylenol 500 mg 2 tablets every 6 hours as needed.  21.  Spironolactone 25 mg p.o. once daily.  22.  Levemir Flex Patch 28 units subcutaneously at bedtime.  23.  Lasix 40 mg p.o. daily; this is a new dose.  24.  Prednisone taper at 50 mg p.o. once on 05/11/2014, then taper by 10 mg every 2 days until stopped.  25.  Levofloxacin 250 mg p.o. daily for the next 7 days.  DRESSING CARE:  The patient should have his ulcers cleansed with normal saline.  1.  Cleanse ulcer of the right buttock with normal saline, pat gently to dry, apply Allevyn silicone border foam; change every 3 days and as needed for soilage.  2.  Cleanse left heel ulcer with normal saline and pat gently dry. Apply Silvadene cream to the wound bed, top with 4x4 gauze, secure with Kerlix; change daily.  3.  Cleanse the right heel with normal saline and pat gently to dry. Paint with Betadine and cover with 4x4 gauze and secure with Kerlix; change daily.  4.  Offload pressure ulcers.   HOME OXYGEN: Two liters of oxygen  through nasal cannula 24/7.  DIET: Two gram salt, low-fat, low-cholesterol, carbohydrate-controlled diet, regular consistency.   ACTIVITY LIMITATIONS: As tolerated.   REFERRAL: To hospice at home.   FOLLOWUP APPOINTMENT: Dr. Dossie Arbour in 2 days after discharge.   CONSULTANTS: Care management, social work, Dwayne D. Juliann Pares, MD and Ned Grace, MD.  RADIOLOGIC STUDIES: Chest x-ray, PA and lateral, 05/07/2014 revealing prominent lung markings which may represent chronic changes. There is no evidence of frank pulmonary edema, and no focal airspace disease. Stable cardiomegaly.   Lung VQ scan 05/07/2014 revealing low probability for pulmonary embolism.   Ultrasound of abdomen, limited survey, 05/07/2014 revealed no evidence of ascites, small right pleural effusion.    Echocardiogram on 05/07/2014 revealing left ventricular ejection fraction by visual estimation 30% to 35%, moderate to severely decreased global left ventricular systolic function, moderately to severely increased left ventricular internal cavity size, right ventricular volume and pressure overload, moderately enlarged right ventricle, severely dilated left atrium, mild to moderate mitral valve regurgitation, moderate to severe tricuspid regurgitation, mildly elevated pulmonary arterial systolic pressure, dilated cardiomyopathy.   The patient is a 74 year old Caucasian male with history of cardiomyopathy, who presents to the hospital with complaints of lower extremity swelling, low oxygen levels, as well as shortness of breath. Please refer to Dr. Eddie North admission note on 05/07/2014. On arrival to the hospital, the patient's vitals were temperature 98.3, pulse 82, blood pressure 122/74, saturation 99% on 3 L of oxygen through nasal cannula. Physical exam revealed bilateral wheezing, as well as crackles, on lung exam. The patient did have 3+ lower extremity edema. He also had a right hemiparesis.   His admission labs done on 05/07/2014, revealed a beta-type natriuretic peptide of 30,020, glucose level of 243, BUN and creatinine were 34 and 2.06, respectively. Sodium level was 132, potassium 5.2; otherwise BMP was unremarkable. Estimated GFR for non African American was 34. The patient's liver enzymes revealed albumin level of 2.4 and elevated AST to 41. Cardiac enzymes x 3 were within normal limits. The patient's white blood cell count was normal at 9.7, hemoglobin was 10.7, platelet count was 198,000. Absolute neutrophil count was not checked. Coagulation Panel: Pro-time was 17.1 and INR was 1.4. ABGs were performed on 24% FiO2 and showed pH of 7.46, pCO2 was 41, pO2 was 57, saturation was 91.1%. EKG showed atrial fibrillation at 95 beats a minute, nonspecific intraventricular conduction block, T-wave  abnormality, consider lateral ischemia.   Radiologic study, chest x-ray PA and lateral, was concerning for a possible prominent lung markings representing chronic changes.   The patient was admitted to the hospital for further evaluation. He was started on antibiotics, as well as some diuretics and steroids, as well as inhalation therapy for mild COPD exacerbation. With these conservative measures, he improved. His oxygen requirement went back to his usual baseline of 2 L of oxygen by nasal cannula.   On the day of discharge, 05/10/2014, the patient's oxygen saturation were 96% at rest on 2 L of oxygen through nasal cannula. The patient was advised to continue antibiotic therapy, to complete the course. He was also advised to continue steroid taper, as well as inhalation therapy.   For acute on chronic systolic congestive heart failure, he was consulted by our cardiologists, and his diuretic dose was advanced. It was recommended to follow the patient's potassium levels as well as kidney function tests with advanced doses of diuretics.   In regard to COPD exacerbation, as mentioned above, the patient is to continue antibiotics to  complete course, and steroid taper.   In regard to CKD, the patient's kidney function remained relatively stable with diuresis. As mentioned above, the patient's  estimated GFR was 34 on the day of admission 05/07/2014; by the day of discharge the patient's GFR was 36, on 05/10/2014.   Regarding diabetes mellitus, the patient's hemoglobin A1c was checked and was found to be 7.8. The patient was advised to continue insulin; dose, however, was advanced.   For stroke with right hemiparesis, the patient was advised to continue his outpatient medications; no changes were done.   In regard to lower extremity cellulitis, and bilateral heel ulcers as well as pressure on the right buttock area, the patient was consulted by wound care, who recommended medical management of these  wounds. The patient will be seen by the hospice nurse, and recommended management.    For his history of hypertension, coronary artery disease, chronic respiratory failure, hyperlipidemia, and peripheral artery disease, the patient is to continue his outpatient medications.   He is being discharged in stable condition with the above-mentioned medications and followup. On the day of discharge, the patient's temperature was 97.6, pulse was 93, respiration rate was 20-22, blood pressure 130/72, saturation 96% on 2 L of oxygen per nasal cannula at rest.   TIME SPENT: 40 minutes.     ____________________________ Katharina Caperima Jarrick Fjeld, MD rv:MT D: 05/10/2014 16:00:50 ET T: 05/10/2014 16:31:54 ET JOB#: [id number]  cc: Katharina Caperima Makaveli Hoard, MD, <Dictator> Steele SizerMark A. Crissman, MD   Noele Icenhour MD ELECTRONICALLY SIGNED 05/21/2014 12:22

## 2014-08-03 NOTE — Consult Note (Signed)
PATIENT NAME:  Larry Rice, Larry Rice MR#:  [id number] DATE OF BIRTH:  [birth year]  DATE OF CONSULTATION:  05/08/2014  REFERRING PHYSICIAN:  Srikar R. Sudini, MD CONSULTING PHYSICIAN:  Adriana Quinby D. Juliann Pares, MD  PRIMARY PHYSICIAN: Steele Sizer, MD  CARDIOLOGIST: Darlin Priestly. Lady Gary, MD.   INDICATION: Shortness of breath, edema, hypoxemia  HISTORY OF PRESENT ILLNESS: The patient is a 74 year old white male with history of chronic systolic heart failure, EF of 04%-54%, COPD, respiratory failure on 2 L, coronary artery disease, coronary bypass surgery, leg edema brought to the Emergency Room by family who noticed low saturations while at the wound center for a chronic wound. The patient has bilateral ulcers of his feet with peripheral vascular disease. The patient was found to have low saturations as part of his vital signs workup at the wound center. He was sent to the Emergency Room with what sounds like arterial saturations in the 70s. The patient complains of shortness of breath, recently started on metolazone to help with edema. He was scheduled echocardiogram and stress test as outpatient with Dr. Lady Gary next week, but came to the Emergency Room for evaluation.   PAST MEDICAL HISTORY: Hypertension, coronary artery disease, diabetes, chronic renal insufficiency, COPD, respiratory failure, CVA, left carotid endarterectomy, peripheral vascular disease, hyperlipidemia, chronic systolic heart failure, peripheral vascular disease, bladder cancer.   PAST SURGICAL HISTORY: Cholecystectomy, coronary bypass surgery, bladder surgery, left endarterectomy, peripheral vascular disease stenting.   ALLERGIES: None.   SOCIAL HISTORY: The patient is married, lives with his wife. He uses a walker but presently in bed because of shortness of breath and leg ulcers. History of smoking.   CODE STATUS: Full code.   FAMILY HISTORY: CVA.   REVIEW OF SYSTEMS: Complained of fatigue, weakness, shortness of breath, ulcers on  his leg. He denies significant chest pain. No blackout spells or syncope. No leg pain. No fever, chills, or sweats. No weight loss or weight gain. No hemoptysis or hematemesis. No bright red blood per rectum.    HOME MEDICATIONS: Aldactone 25 mg a day, lisinopril 10 mg a day, baclofen 10 mg 3 times a day, calcium with D once a day, Plavix 75 a day, Cardizem 240 once a day, duloxetine 60 mg a day, ferrous sulfate 325 twice a day, fish oil 1000 mg twice a day, Lasix 20 mg twice a day, gabapentin 300 once a day, Levemir 25 units subcutaneous once a day, metolazone 5 mg once a day, metoprolol tartrate 25 mg twice a day, Remeron 50 mg a day, NovoLog FlexPen 3 times a day, tamsulosin 0.4 once a day, Tylenol with codeine 10/325 every 4 hours, Anoro Ellipta 1 puff once a day.   PHYSICAL EXAMINATION:  VITAL SIGNS: Blood pressure 120/70, pulse of 80, respiratory rate 16, afebrile, on 3 L saturations are 99.  HEENT: Normocephalic, atraumatic. Pupils equal and reactive to light.  NECK: Supple. No significant JVD, bruits, or adenopathy.  LUNGS: Bilateral rhonchi. Adequate air movement. No significant scattered wheezing, mildly depressed in the bases.  HEART: Regular rate and rhythm. Systolic ejection murmur at the apex. PMI nondisplaced.  ABDOMEN: Benign.  EXTREMITIES: Edema 2-3+ with mild redness, chronic ulcers.  NEUROLOGIC: Intact.  SKIN: Normal.   LABORATORY DATA:, glucose of 243. BNP  elevated, creatinine 2.06, sodium 133, potassium 4.2, chloride of 95. LFTs negative. Troponin 0.03. CK 281. White count 9.7, hemoglobin of 10.7, platelet count of 198,000, INR 1.4. ABG: 7.4, pCO2 of 46, oxygen saturation 38 on 2 L, not sure if  that is venous.   EKG: Atrial fibrillation, controlled rate at about 85, nonspecific findings.   Chest x-ray: Prominent markings, no clear edema, nonspecific findings.   ASSESSMENT:  1.  Acute on chronic respiratory failure.  2.  Acute and chronic congestive heart failure with  systolic dysfunction.  3.  Renal insufficiency.  4.  Diabetes.  5.  Cerebrovascular accident.  6.  Obesity.  7.  Leg edema.  8.  Peripheral vascular disease  9.  Benign prostatic hypertrophy.   PLAN:  1.  I agree to admit. Continue aggressive respiratory therapy with inhalers, supplemental oxygen, diuretics, steroid therapy. Consider pulmonary input for further evaluation and management. Consider BiPAP or CPAP. 2.  Coronary artery disease. Continue current therapy for known coronary artery disease. No direct intervention because of renal insufficiency. Consider echocardiogram. Also consider functional study.  3.  Renal insufficiency. Consider nephrology input. Continue diuretic therapy.  4.  Diabetes. Continue current control with Levemir, probably add Lantus, continue sliding scale therapy. Hemoglobin A1c, fasting sugars would be helpful. Diabetic diet.  5.  CVA history. Stable. Continue aspirin therapy for anticoagulation.  6.  For edema I recommend support stockings, elevation, diuretic therapy. 7.  Peripheral vascular disease. Would consider vascular involvement with extensive peripheral vascular disease and therapy. 8.  Atrial fibrillation. Continue rate control, short-term anticoagulation. Must consider long-term anticoagulation with significant CHADS score. 9.  Hypoxemia. Supplemental oxygen.  10.  We will treat the patient conservatively for now and follow up and see if he improves his oxygenation and respiratory status with aggressive therapy.     ____________________________ Bobbie Stackwayne D. Juliann Paresallwood, MD ddc:bm D: 05/08/2014 23:18:09 ET T: 05/09/2014 01:07:35 ET JOB#: [id number]  cc: Simon Aaberg D. Juliann Paresallwood, MD, <Dictator> Alwyn PeaWAYNE D Taje Tondreau MD ELECTRONICALLY SIGNED 05/13/2014 17:39

## 2014-08-03 NOTE — Consult Note (Signed)
Chief Complaint:  Subjective/Chief Complaint Shortness of breath improve still has a weak congestion denies any pain.  leg wound seems to be improving a on antibiotics.   VITAL SIGNS/ANCILLARY NOTES: **Vital Signs.:   06-Feb-16 09:30  Vital Signs Type Q 4hr  Temperature Temperature (F) 97.6  Celsius 36.4  Temperature Source oral  Respirations Respirations 22  Systolic BP Systolic BP 481  Diastolic BP (mmHg) Diastolic BP (mmHg) 72  Mean BP 91  Pulse Ox % Pulse Ox % 96  Pulse Ox Activity Level  At rest  Oxygen Delivery 2L; Nasal Cannula  *Intake and Output.:   06-Feb-16 08:43  Urine ml     Out:  400  Urinary Method  Urinal   Brief Assessment:  GEN well developed, well nourished, no acute distress, obese   Cardiac Regular  murmur present  + LE edema   Respiratory normal resp effort  clear BS   Gastrointestinal Normal   Gastrointestinal details normal Soft  Nontender  Nondistended   EXTR positive edema   Lab Results: Routine Chem:  06-Feb-16 04:17   Glucose, Serum  180  BUN  64  Creatinine (comp)  1.97  Sodium, Serum  132  Potassium, Serum 4.6  Chloride, Serum  94  CO2, Serum  33  Calcium (Total), Serum 9.0  Anion Gap  5  Osmolality (calc) 287  eGFR (African American)  43  eGFR (Non-African American)  36 (eGFR values <41mL/min/1.73 m2 may be an indication of chronic kidney disease (CKD). Calculated eGFR, using the MRDR Study equation, is useful in  patients with stable renal function. The eGFR calculation will not be reliable in acutely ill patients when serum creatinine is changing rapidly. It is not useful in patients on dialysis. The eGFR calculation may not be applicable to patients at the low and high extremes of body sizes, pregnant women, and vegetarians.)  Hemoglobin A1c ([facility])  7.8 (The American Diabetes Association recommends that a primary goal of therapy should be <7% and that physicians should reevaluate the treatment regimen in patients with  HbA1c values consistently >8%.)  Routine Hem:  06-Feb-16 04:17   WBC (CBC) 7.9  RBC (CBC)  4.02  Hemoglobin (CBC)  9.9  Hematocrit (CBC)  31.7  Platelet Count (CBC) 224  MCV  79  MCH  24.6  MCHC  31.2  RDW  20.6  Neutrophil % 81.7  Lymphocyte % 9.5  Monocyte % 8.7  Eosinophil % 0.0  Basophil % 0.1  Neutrophil # 6.4  Lymphocyte #  0.8  Monocyte # 0.7  Eosinophil # 0.0  Basophil # 0.0 (Result(s) reported on 10 May 2014 at 05:23AM.)   Radiology Results: XRay:    03-Feb-16 11:47, Chest PA and Lateral  Chest PA and Lateral   REASON FOR EXAM:    Shortness of Breath  COMMENTS:   May transport without cardiac monitor    PROCEDURE: DXR - DXR CHEST PA (OR AP) AND LATERAL  - May 07 2014 11:47AM     CLINICAL DATA:  Coughing for 1 week with shortness of breath. Former  smoker.    EXAM:  CHEST  2 VIEW    COMPARISON:  03/18/2014    FINDINGS:  Stable enlargement of the cardiac silhouette with a left cardiac  ICD. Patient has median sternotomy wires. Prominent lung markings  without frank pulmonary edema and no focal airspace disease.  Negative for pleural effusions.     IMPRESSION:  Prominent lung markings may represent chronic changes. There is  no  evidence for frank pulmonary edema and no focal airspace disease.    Stable cardiomegaly.      Electronically Signed    By: Markus Daft M.D.    On: 05/07/2014 12:04     Verified By: Burman Riis, M.D.,  [place]:    03-Feb-16 15:27, US Abdomen Limited Survey  US Abdomen Limited Survey   REASON FOR EXAM:    distention. ascitis  COMMENTS:   Body Site: Ascites search - Abd. Quadrants imaged for free   fluid    PROCEDURE: [place]  - US ABDOMEN LIMITED SURVEY  - May 07 2014  3:27PM     CLINICAL DATA:  Abdominal distention.  Ascites.    EXAM:  US ABDOMEN LIMITED - RIGHT UPPER QUADRANT    COMPARISON:  None.    FINDINGS:  There is no visible ascites. There is a small right pleural  effusion.     IMPRESSION:  No evidence of  ascites.  Small right pleural effusion.      Electronically Signed  By: Rozetta Nunnery M.D.    On: 05/07/2014 15:50         Verified By: Larey Seat, M.D.,  Cardiology:    03-Feb-16 11:28, ED ECG  Ventricular Rate 95  Atrial Rate 87  QRS Duration 144  QT 410  QTc 515  R Axis 126  T Axis 162  ECG interpretation   Atrial fibrillation with occasional Statement Not Found (#184)  Non-specific intra-ventricular conduction block  T wave abnormality, consider lateral ischemia  Abnormal ECG  When compared with ECG of 18-Mar-2014 00:59,  QRS axis Shifted right  T waveinversion no longer evident in Anterior leads  ----------unconfirmed----------  Confirmed by OVERREAD, NOT ([id number]), editor PEARSON, BARBARA ([id number]) on 05/08/2014 7:57:26 AM  ED ECG     03-Feb-16 18:11, Echo Doppler  Echo Doppler   REASON FOR EXAM:      COMMENTS:       PROCEDURE: [facility] - ECHO DOPPLER COMPLETE(TRANSTHOR)  - May 07 2014  6:11PM     RESULT: Echocardiogram Report    Patient Name:   Larry Rice Date of Exam: 05/07/2014  Medical Rec #:  [id number]            Custom1:  Date of Birth:  May 31, 1940          Height:  Patient Age:    74 years          Weight:  Patient Gender: M                 BSA:    Indications: CHF  Sonographer:    Janalee Dane RCS  Referring Phys: Hillary Bow, R    Summary:   1. Left ventricular ejection fraction, by visual estimation, is 30 to   35%.   2. Moderately to severely decreased global left ventricular systolic   function.   3. Moderate to severely increased left ventricular internal cavity size.   4. Right ventricular volume and pressure overload.   5. Moderately enlarged right ventricle.   6. Severely dilated left atrium.   7. Mild to moderate mitral valve regurgitation.   8. Moderate to severe tricuspid regurgitation.   9. Mildly elevated pulmonary artery systolic pressure.  10. Dilated cardiomyopathy.  2D AND M-MODE MEASUREMENTS (normal ranges within  parentheses):  Left Ventricle:          Normal  IVSd (2D):      0.91 cm (0.7-1.1)  LVPWd (  2D):     1.09 cm (0.7-1.1) Aorta/LA:                  Normal  LVIDd (2D):     6.29 cm (3.4-5.7) Aortic Root (2D): 3.70 cm (2.4-3.7)  LVIDs (2D):     5.14 cm           Left Atrium (2D): 6.30 cm (1.9-4.0)  LV FS (2D):     18.3 %   (>25%)  LV EF (2D):     37.1 %   (>50%)                                    Right Ventricle:                       RVd (2D):  SPECTRAL DOPPLER ANALYSIS (where applicable):  Tricuspid Valve and PA/RV Systolic Pressure: TR Max Velocity: 2.76 m/s RA   Pressure: 15 mmHg RVSP/PASP: 45.4 mmHg    PHYSICIAN INTERPRETATION:  Left Ventricle: The left ventricular internal cavity size was moderate to   severely increased. LV septal wall thickness was normal. LV posterior   wall thickness was normal. Global LV systolic function was moderately to   severely decreased. Left ventricular ejection fraction, by visual   estimation, is 30 to 35%. The interventricular septum is flattened in   systole and diastole, consistent with right ventricular pressure and   volume overload. Findings are consistent with dilated cardiomyopathy.  Right Ventricle: The right ventricular size is moderately enlarged.   Global RV systolic function is mildly reduced.  Left Atrium: The left atrium is severely dilated.  Right Atrium: The right atrium is normal in size.  Pericardium: There is no evidence of pericardial effusion.  Mitral Valve: The mitral valve is normal in structure. Mild to moderate   mitral valve regurgitation is seen.  Tricuspid Valve: The tricuspid valve is normal. Moderate to severe     tricuspid regurgitation is visualized. The tricuspid regurgitant velocity   is 2.76 m/s, and with an assumed right atrial pressure of 15 mmHg, the   estimated right ventricular systolic pressure is mildly elevated at 45.4   mmHg.  Aortic Valve: The aortic valve is normal. Trivial aortic valve   regurgitation  is seen.  Pulmonic Valve: The pulmonic valve is normal. Trace pulmonic valve   regurgitation.  Additional Comments: A pacer wire is visualized.    South English MD  Electronically signed by Blue Lujean Amel MD  Signature Date/Time: 05/09/2014/8:14:00 AM    *** Final ***  IMPRESSION: .        Verified By: Yolonda Kida, M.D., MD  Nuclear Med:    03-Feb-16 16:22, Lung VQ Scan - Nuc Med  Lung VQ Scan - Nuc Med   REASON FOR EXAM:    hypoxia  COMMENTS:       PROCEDURE: NM  - NM VQ LUNG SCAN  - May 07 2014  4:22PM     CLINICAL DATA:  Hypoxia and history of renal insufficiency.    EXAM:  NUCLEAR MEDICINE VENTILATION - PERFUSION LUNG SCAN    TECHNIQUE:  Ventilation images were obtained in multiple projections using  inhaled aerosol technetium 99 M DTPA. Perfusion images were obtained  in multiple projections after intravenous injection of Tc-70mMAA.  RADIOPHARMACEUTICALS:  37.412 mCi Tc-971mTPA aerosol and 4.264 mCi  Tc-9969mA  COMPARISON:  None.    FINDINGS:  Small perfusion defect in the region of the anterior segment right  upper lobe, best visualized on the RAO projection. This is favored  to represent a normal hilar structure, and regardless appears  matched (there is photopenia just above high activity in the  bronchial tree on RAO ventilation). No suspicious, unmatched defect.     IMPRESSION:  Low probability for pulmonary embolism.  Electronically Signed    By: Jorje Guild M.D.    On:05/07/2014 16:51         Verified By: Gilford Silvius, M.D.,   Assessment/Plan:  Assessment/Plan:  Assessment congestive heart failure improving systolic dysfunction continue Lasix therapy  cardiomyopathy EF about 30% continue current therapy  acute on chronic respiratory failure continue inhalers continue Lasix continue supplemental oxygen  chronic renal insufficiency acute on chronic have the patient follow-up with Nephrology as necessary  diabetes  continue current therapy follow-up fasting sugars and hemoglobin A1c  continue antibiotic therapy for chronic ulcer recommend wound care  COPD continue inhalers  DVT prophylaxis to be continued  obesity recommend weight loss exercise portion control   Plan consider Pulmonary input an evaluation for  respiratory failure  probable coronary disease continue current therapy   Electronic Signatures: Lujean Amel D (MD)  (Signed 06-Feb-16 12:08)  Authored: Chief Complaint, VITAL SIGNS/ANCILLARY NOTES, Brief Assessment, Lab Results, Radiology Results, Assessment/Plan   Last Updated: 06-Feb-16 12:08 by Lujean Amel D (MD)

## 2016-04-21 IMAGING — CT CT HEAD WITHOUT CONTRAST
1 series · 15 of 30 positions shown, 19 images · non-contrast
Comparison: May 30, 2013

CLINICAL DATA: Pain post trauma

EXAM:
CT HEAD WITHOUT CONTRAST
TECHNIQUE: Contiguous axial images were obtained from the base of the skull
through the vertex without intravenous contrast.

[Series 2: head wo · axial · 0.40mm/px · z∈[-133,+11]mm · 15 of 36 slices shown, 19 images]
[im 2/36  brain]
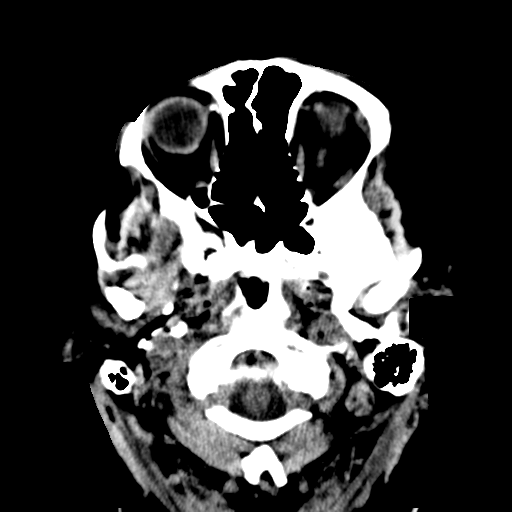
[im 2/36  bone]
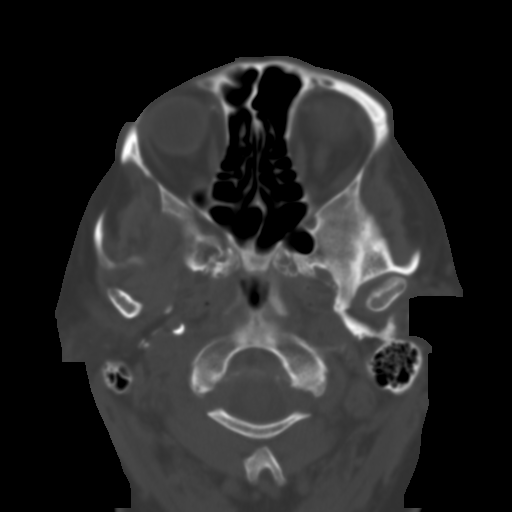
[im 4/36  brain]
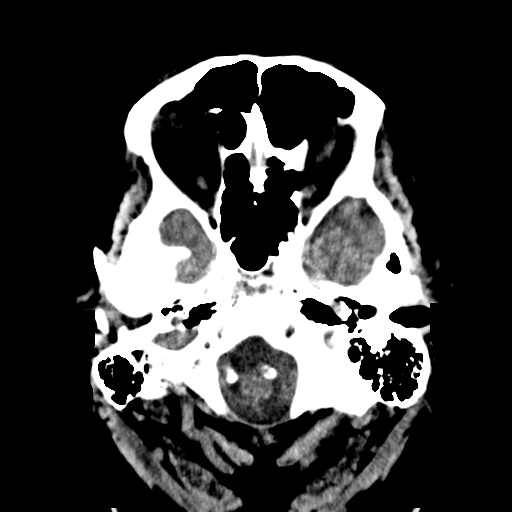
[im 7/36  brain]
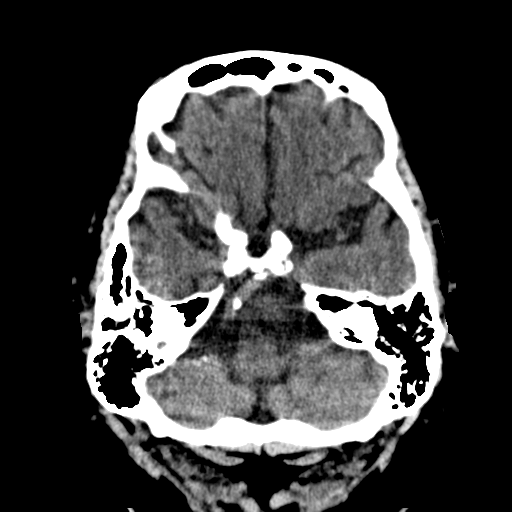
[im 9/36  brain]
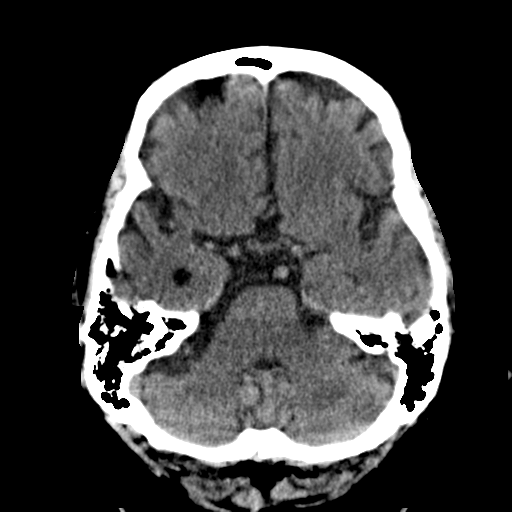
[im 11/36  brain]
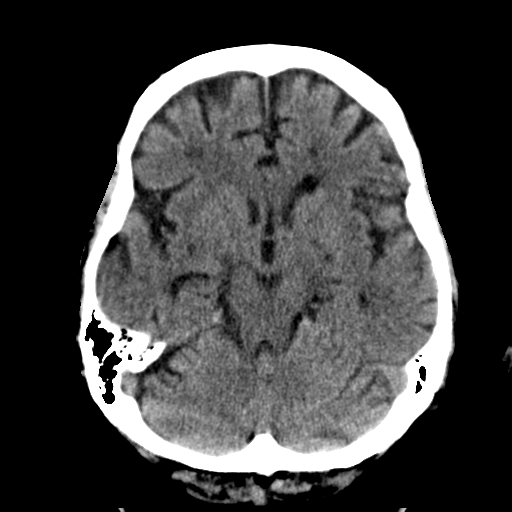
[im 11/36  bone]
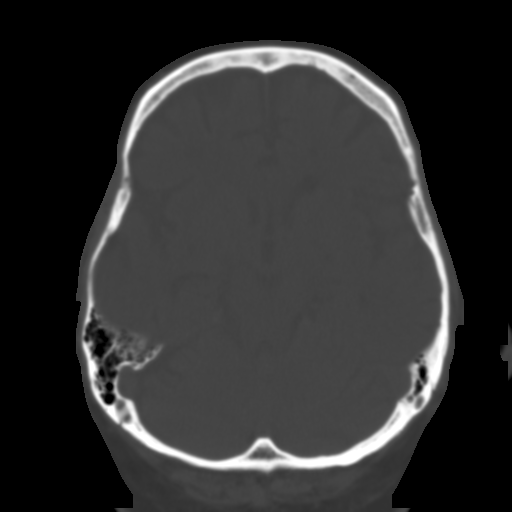
[im 14/36  brain]
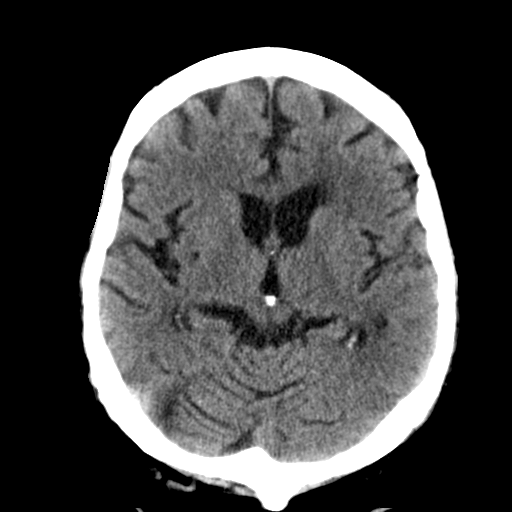
[im 16/36  brain]
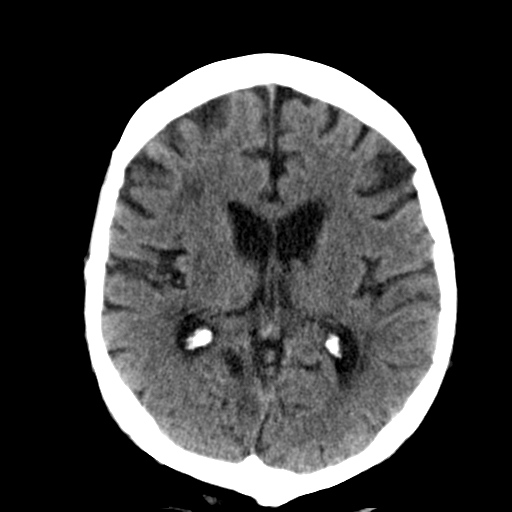
[im 19/36  brain]
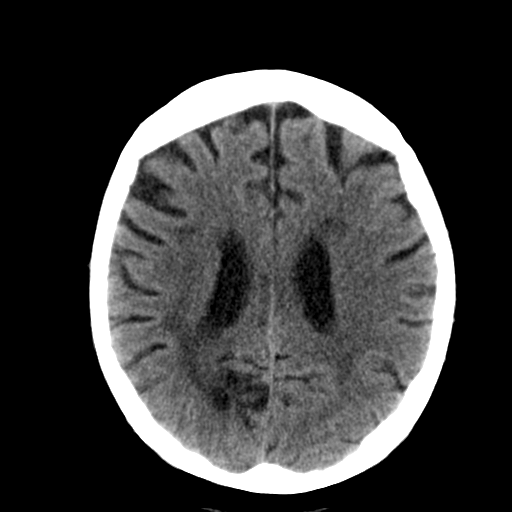
[im 20/36  brain]
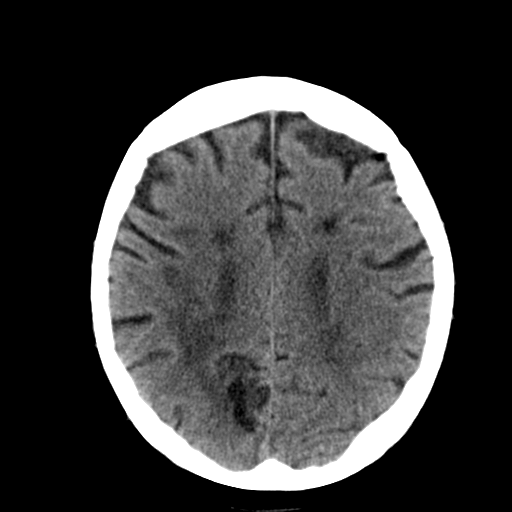
[im 20/36  bone]
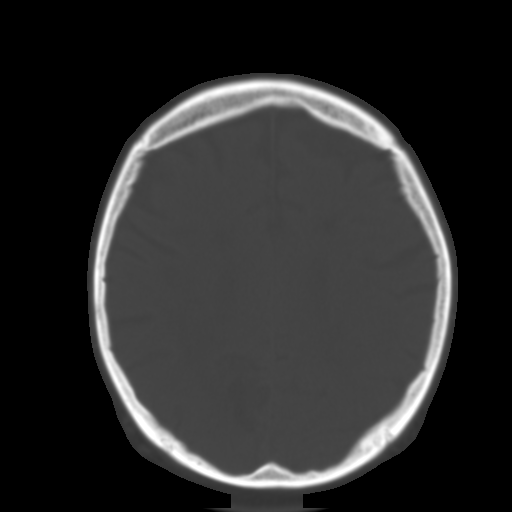
[im 22/36  brain]
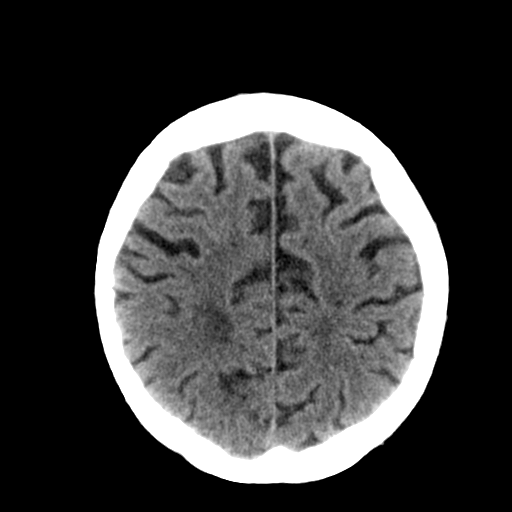
[im 25/36  brain]
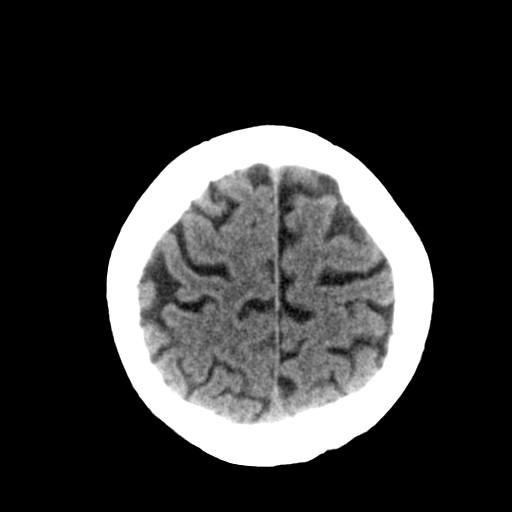
[im 27/36  brain]
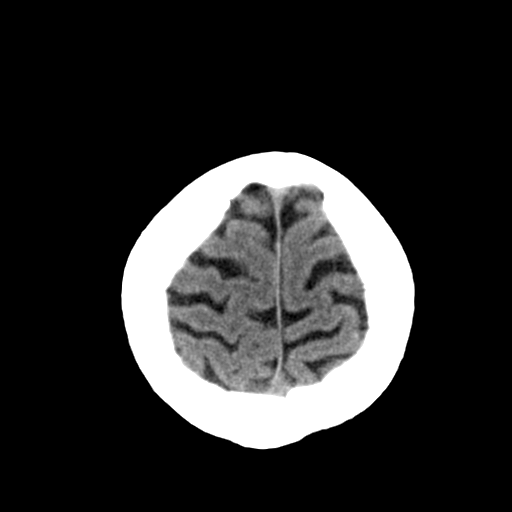
[im 29/36  brain]
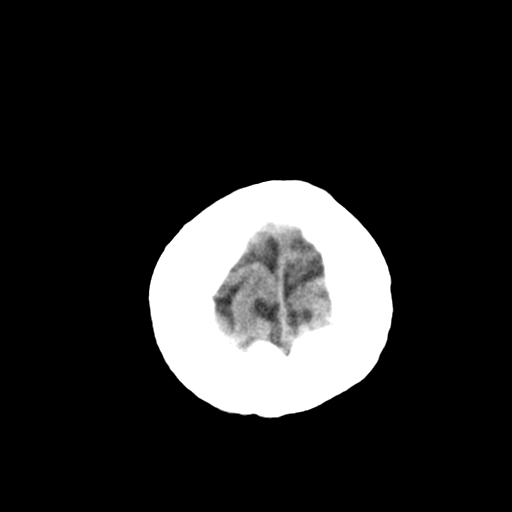
[im 29/36  bone]
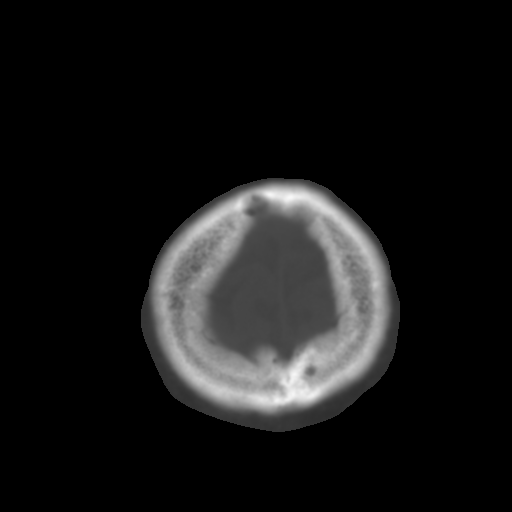
[im 32/36  brain]
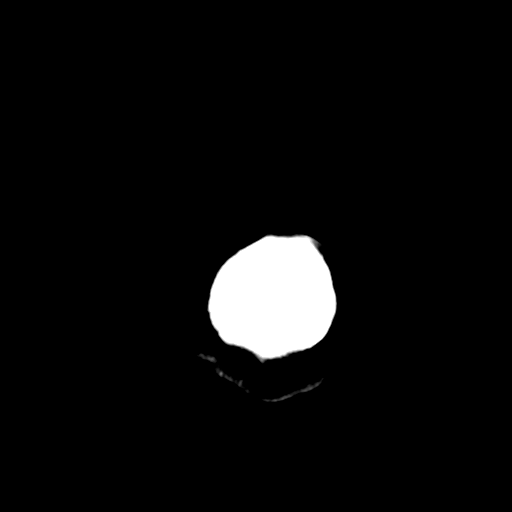
[im 34/36  brain]
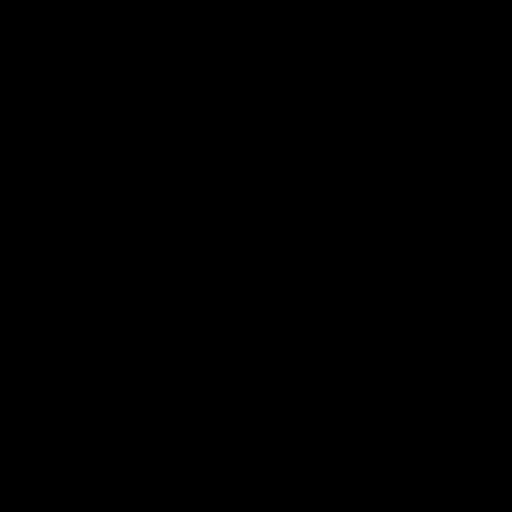

[15 of 30 positions shown; findings below may reference images not displayed]

FINDINGS: Moderate diffuse atrophy is stable. There is no appreciable mass,
hemorrhage, extra-axial fluid collection, or midline shift.

There is an apparent acute infarct in the medial inferior right
parietal lobe including the right parietal - occipital junction. No
other acute infarct is present. There is supratentorial small vessel
disease, primarily in the right corona radiata, stable. No new
gray-white compartment lesions are identified beyond the apparent
acute infarct near the right parietal -occipital junction. Bony
calvarium appears intact. The mastoid air cells are clear.
IMPRESSION: Apparent acute infarct in the medial right parietal lobe near the
right parietal -occipital junction. There is atrophy with moderate
supratentorial small vessel disease which elsewhere is stable. There
is no appreciable hemorrhage or extra-axial fluid.

## 2016-11-04 IMAGING — CR DG CHEST 1V PORT
1 series · 2 of 2 positions shown · non-contrast
Comparison: None.

CLINICAL DATA: Respiratory distress

EXAM:
PORTABLE CHEST - 1 VIEW

[Series 1: ap · 0.17mm/px · 2 of 2 slices shown]
[im 1/2]
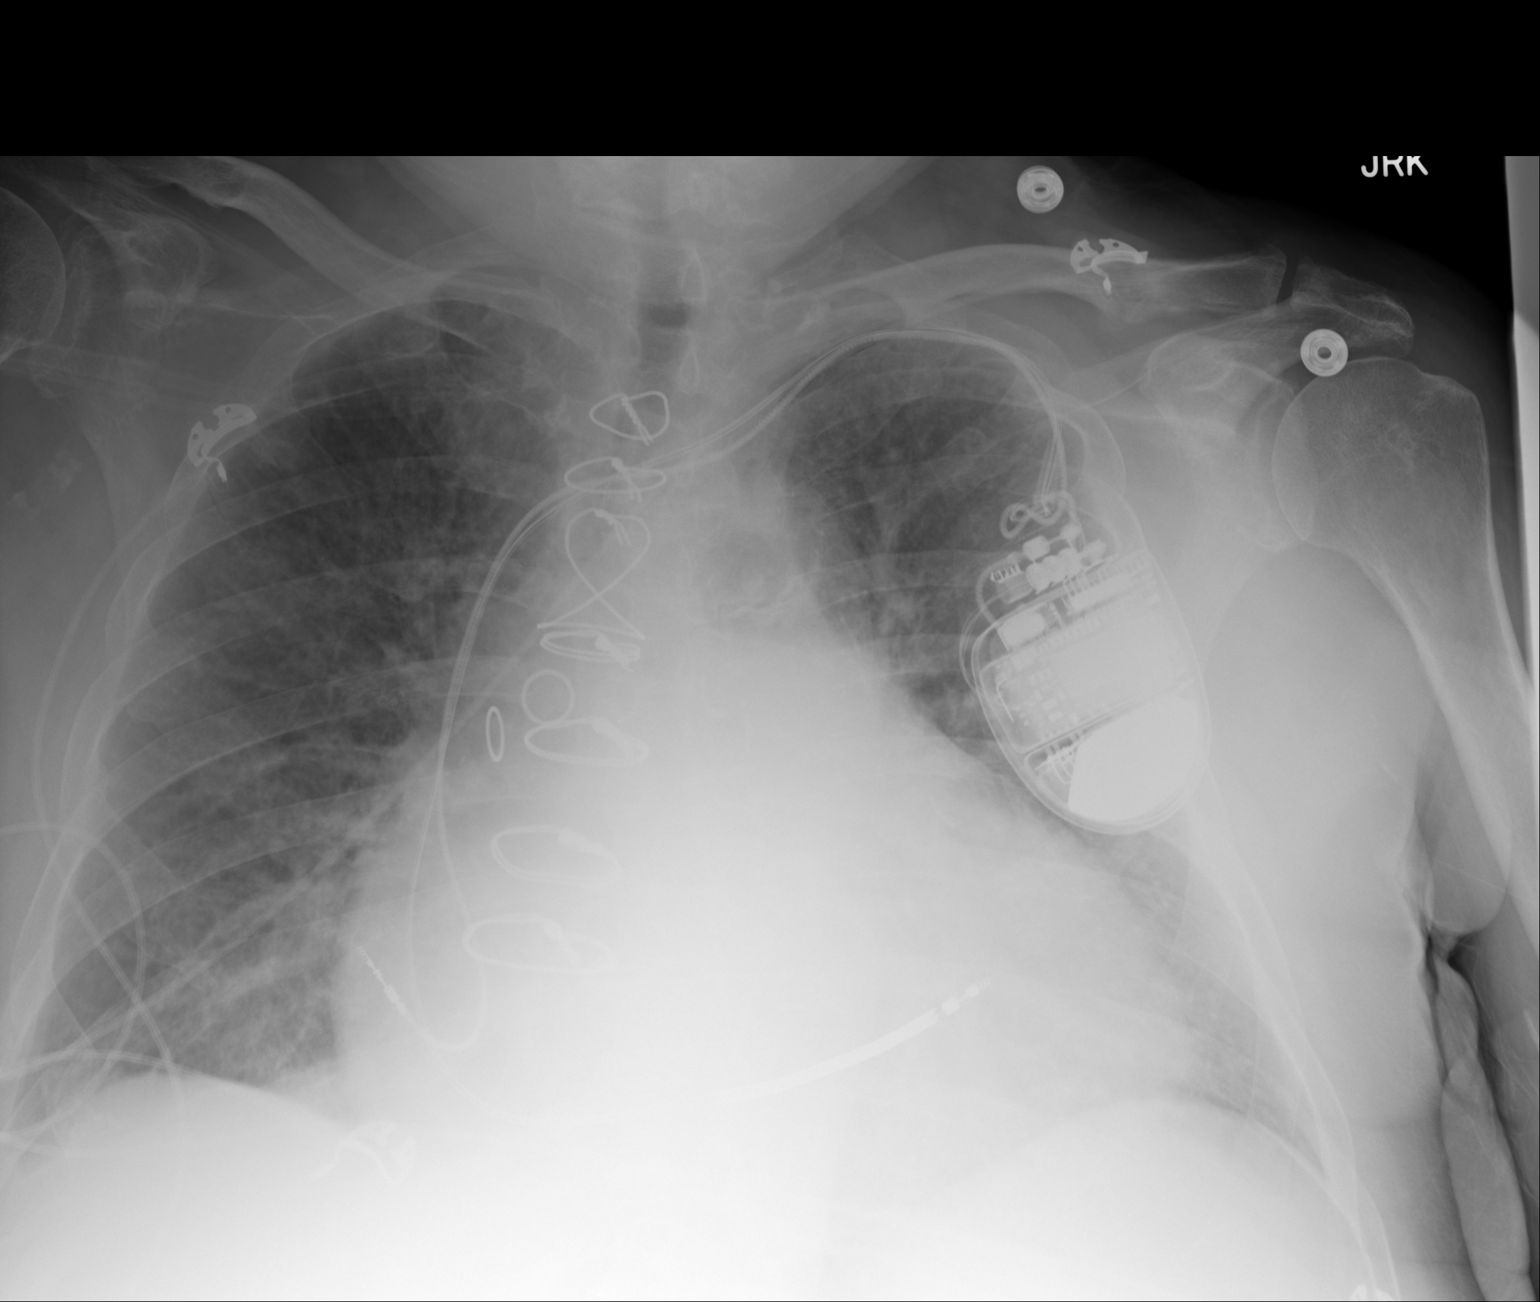
[im 2/2]
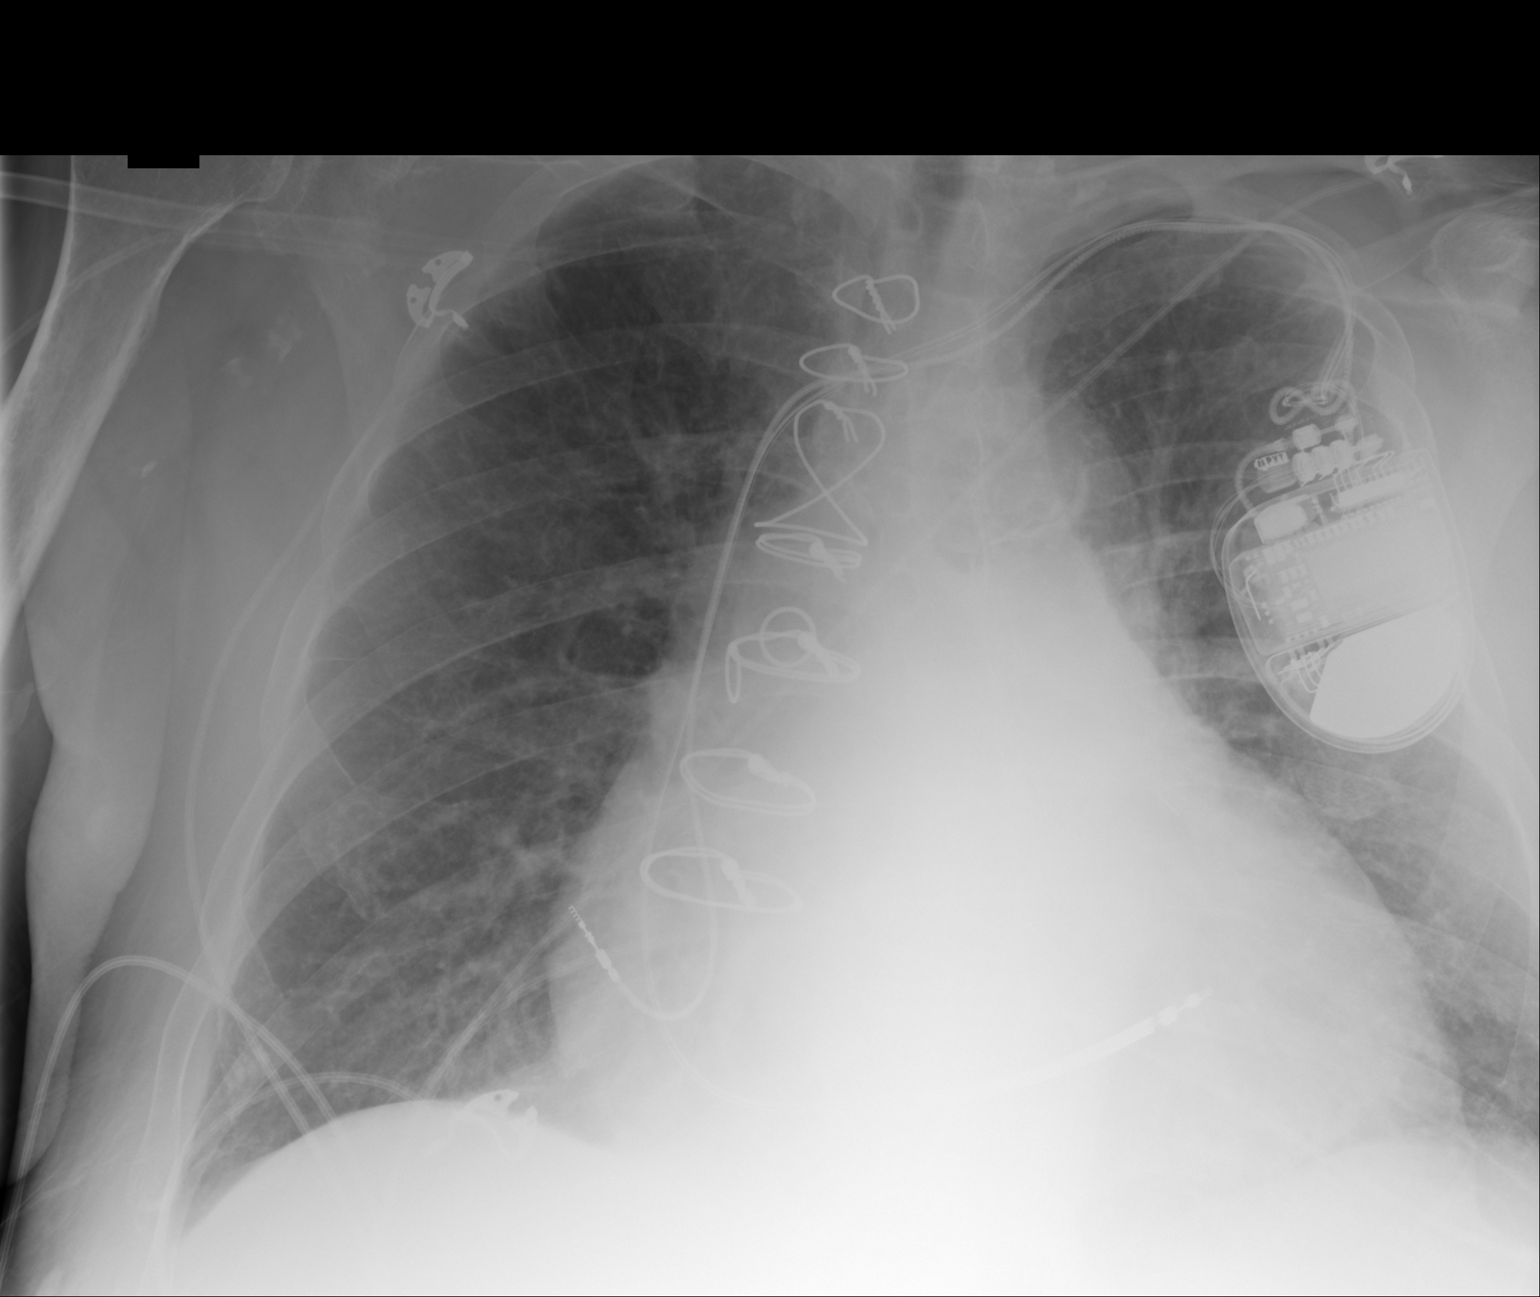

[2 of 2 positions shown; findings below may reference images not displayed]

FINDINGS: Cardiac shadow is enlarged. A defibrillator is noted. Postsurgical
changes are seen. The lungs are well aerated bilaterally. Mild
vascular congestion is noted. No focal confluent infiltrate is seen.
IMPRESSION: Mild vascular congestion.

## 2016-11-04 IMAGING — CT CT HEAD WITHOUT CONTRAST
1 of 2 series · 13 of 30 positions shown, 17 images · non-contrast
Comparison: 09/02/2013

CLINICAL DATA: Aaphasia.  History of cardiovascular accident.

EXAM:
CT HEAD WITHOUT CONTRAST
TECHNIQUE: Contiguous axial images were obtained from the base of the skull
through the vertex without intravenous contrast.

[Series 2: head wo · axial · 0.43mm/px · z∈[-145,-20]mm · 13 of 31 slices shown, 17 images]
[im 3/31  brain]
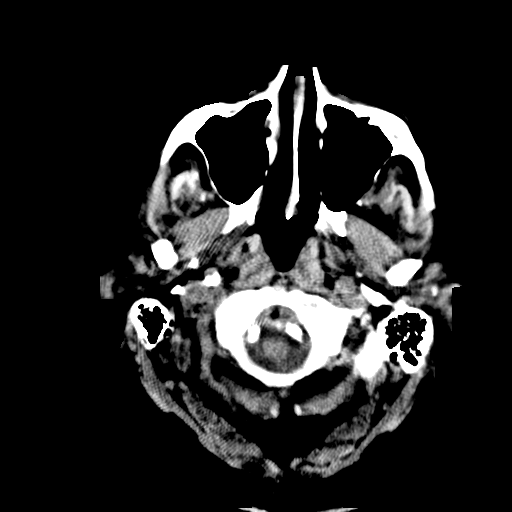
[im 3/31  bone]
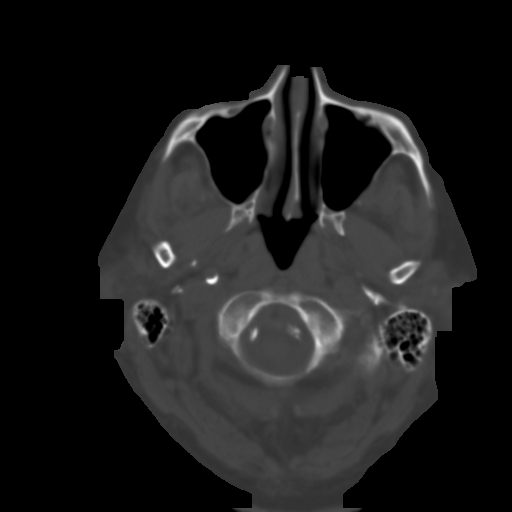
[im 5/31  brain]
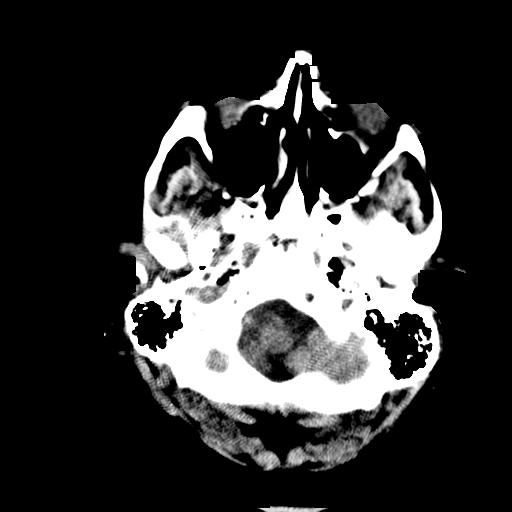
[im 7/31  brain]
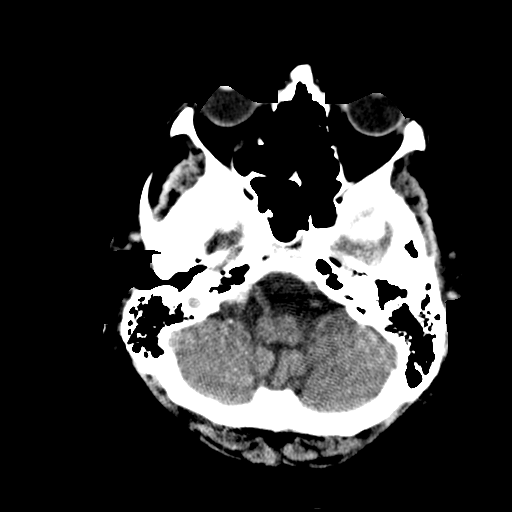
[im 9/31  brain]
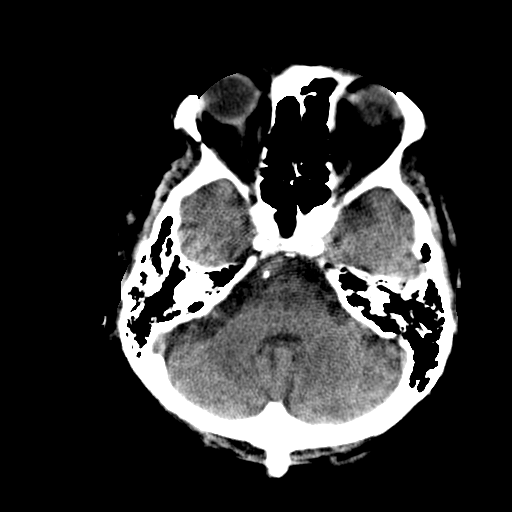
[im 11/31  brain]
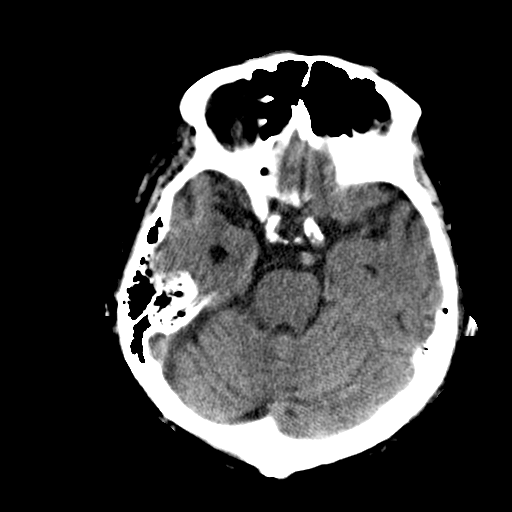
[im 11/31  bone]
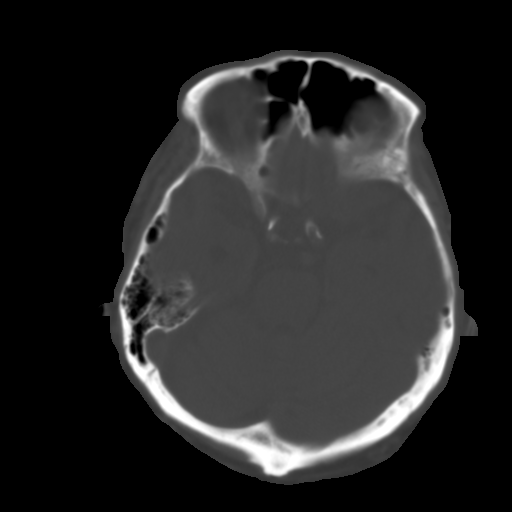
[im 13/31  brain]
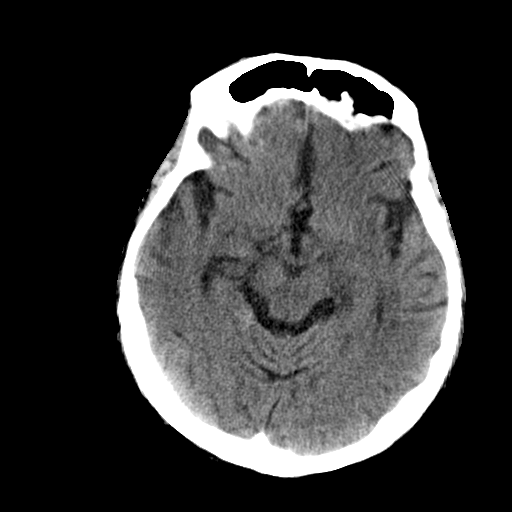
[im 16/31  brain]
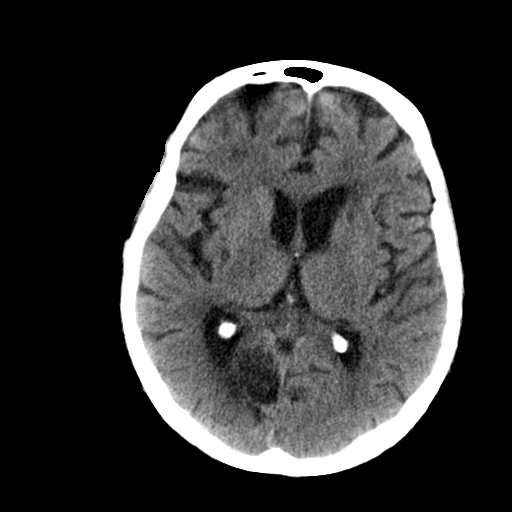
[im 18/31  brain]
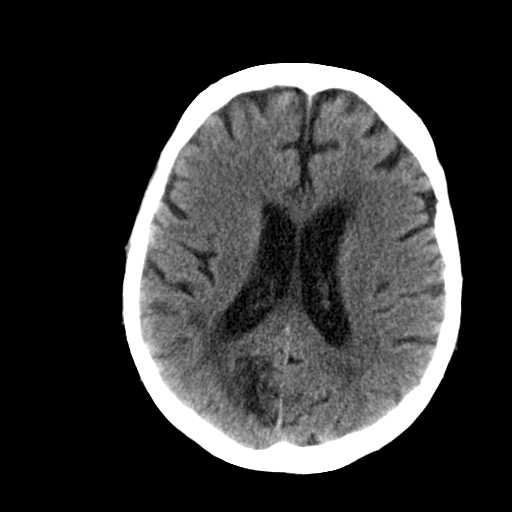
[im 20/31  brain]
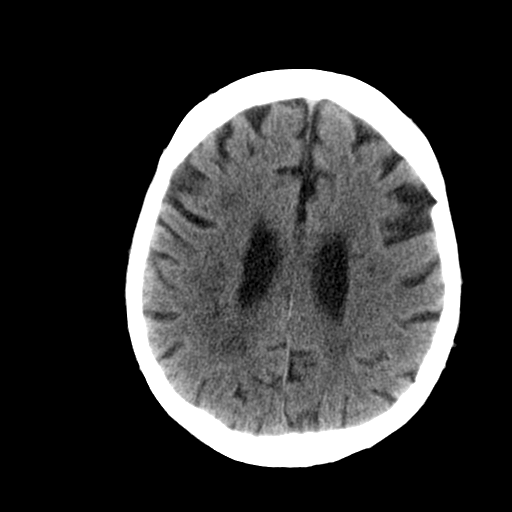
[im 20/31  bone]
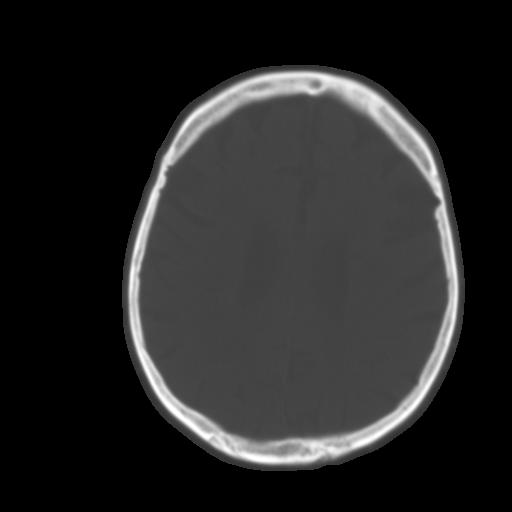
[im 22/31  brain]
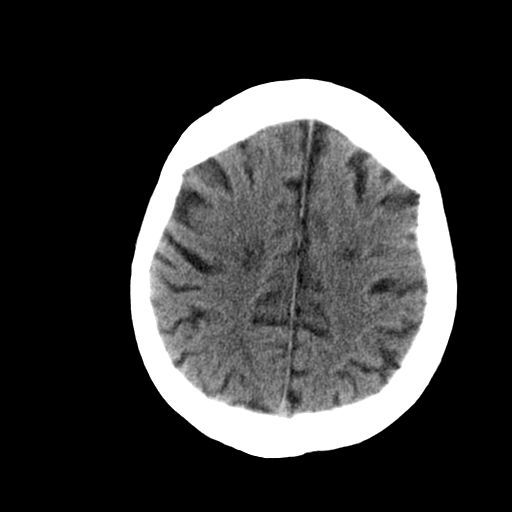
[im 24/31  brain]
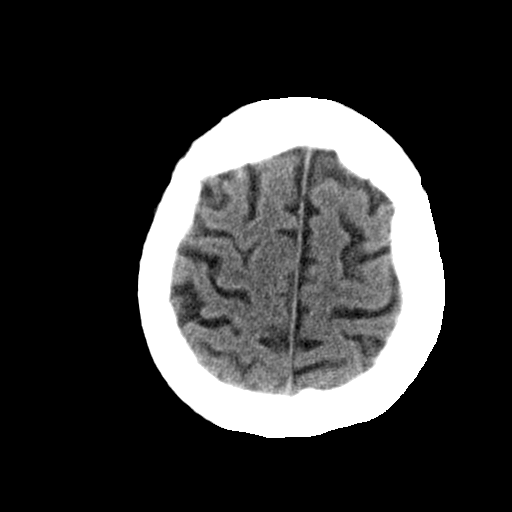
[im 26/31  brain]
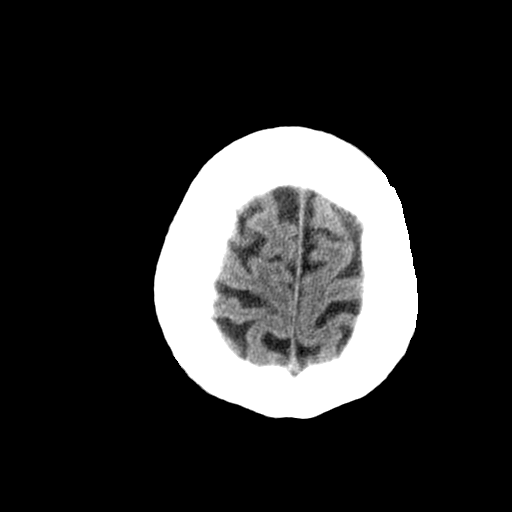
[im 28/31  brain]
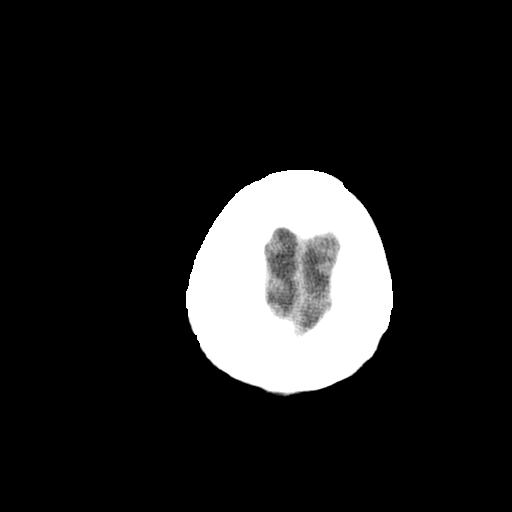
[im 28/31  bone]
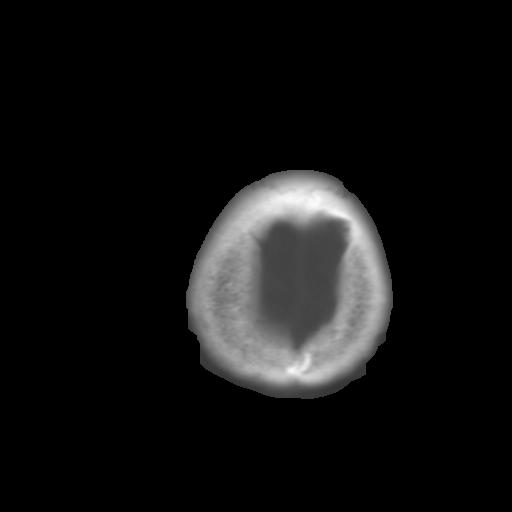

[13 of 30 positions shown; findings below may reference images not displayed]

FINDINGS: Skull and Sinuses:Negative for fracture or destructive process. The
mastoids, middle ears, and imaged paranasal sinuses are clear.

Orbits: No acute abnormality.

Brain: No evidence of acute infarction, hemorrhage, hydrocephalus,
or mass lesion/mass effect.

There is stable appearance of gliosis in the right occipital lobe
related to remote PCA territory infarct. Unchanged pattern of patchy
bilateral cerebral white matter low density consistent with chronic
small vessel disease. Focal/notable white matter ischemic injury
again seen around the frontal horn and temporal horn of the left
lateral ventricle.

There is cerebral volume loss which is moderate for age.
IMPRESSION: 1. No acute intracranial findings.
2. Extensive chronic small vessel disease and remote right PCA
territory infarct.
